# Patient Record
Sex: Male | Born: 1995 | Race: White | Hispanic: Yes | Marital: Single | Smoking: Never smoker
Health system: Southern US, Community
[De-identification: ages and names within clinical notes are randomized; demographics above are authoritative.]

---
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
## 2014-10-03 ENCOUNTER — Encounter (HOSPITAL_COMMUNITY): Payer: Self-pay | Admitting: Emergency Medicine

## 2014-10-03 ENCOUNTER — Emergency Department (HOSPITAL_COMMUNITY)
Admission: EM | Admit: 2014-10-03 | Discharge: 2014-10-04 | Disposition: A | Discharge status: Home / Self Care | Payer: Medicaid Other | Attending: Emergency Medicine | Admitting: Emergency Medicine

## 2014-10-03 ENCOUNTER — Emergency Department (HOSPITAL_COMMUNITY): Payer: Medicaid Other

## 2014-10-03 DIAGNOSIS — Y9289 Other specified places as the place of occurrence of the external cause: Secondary | ICD-10-CM | POA: Insufficient documentation

## 2014-10-03 DIAGNOSIS — S5002XA Contusion of left elbow, initial encounter: Secondary | ICD-10-CM | POA: Insufficient documentation

## 2014-10-03 DIAGNOSIS — S0083XA Contusion of other part of head, initial encounter: Secondary | ICD-10-CM | POA: Insufficient documentation

## 2014-10-03 DIAGNOSIS — Y998 Other external cause status: Secondary | ICD-10-CM | POA: Insufficient documentation

## 2014-10-03 DIAGNOSIS — Y9389 Activity, other specified: Secondary | ICD-10-CM | POA: Insufficient documentation

## 2014-10-03 DIAGNOSIS — Z720 Tobacco use: Secondary | ICD-10-CM | POA: Insufficient documentation

## 2014-10-03 MED ORDER — HYDROCODONE-ACETAMINOPHEN 5-325 MG PO TABS
1.0000 | ORAL_TABLET | Freq: Once | ORAL | Status: AC
  Administered 2014-10-03: 1 via ORAL
  Filled 2014-10-03: qty 1
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
## 2014-10-04 MED ORDER — METHOCARBAMOL 500 MG PO TABS: 500.0000 mg | ORAL_TABLET | Freq: Two times a day (BID) | ORAL | Status: DC

## 2014-10-04 MED ORDER — NAPROXEN 500 MG PO TABS: 500.0000 mg | ORAL_TABLET | Freq: Two times a day (BID) | ORAL | Status: DC

## 2014-10-04 MED ORDER — KETOROLAC TROMETHAMINE 60 MG/2ML IM SOLN
60.0000 mg | Freq: Once | INTRAMUSCULAR | Status: AC
  Administered 2014-10-04: 60 mg via INTRAMUSCULAR
  Filled 2014-10-04: qty 2

## 2014-10-04 NOTE — Discharge Instructions (Signed)
Contusion °A contusion is the result of an injury to the skin and underlying tissues and is usually caused by direct trauma. The injury results in the appearance of a bruise on the skin overlying the injured tissues. Contusions cause rupture and bleeding of the small capillaries and blood vessels and affect function, because the bleeding infiltrates muscles, tendons, nerves, or other soft tissues.  °SYMPTOMS  °· Swelling and often a hard lump in the injured area, either superficial or deep. °· Pain and tenderness over the area of the contusion. °· Feeling of firmness when pressure is exerted over the contusion. °· Discoloration under the skin, beginning with redness and progressing to the characteristic "black and blue" bruise. °CAUSES  °A contusion is typically the result of direct trauma. This is often by a blunt object.  °RISK INCREASES WITH: °· Sports that have a high likelihood of trauma (football, boxing, ice hockey, soccer, field hockey, martial arts, basketball, and baseball). °· Sports that make falling from a height likely (high-jumping, pole-vaulting, skating, or gymnastics). °· Any bleeding disorder (hemophilia) or taking medications that affect clotting (aspirin, nonsteroidal anti-inflammatory medications, or warfarin [Coumadin]). °· Inadequate protection of exposed areas during contact sports. °PREVENTION °· Maintain physical fitness: °¨ Joint and muscle flexibility. °¨ Strength and endurance. °¨ Coordination. °· Wear proper protective equipment. Make sure it fits correctly. °PROGNOSIS  °Contusions typically heal without any complications. Healing time varies with the severity of injury and intake of medications that affect clotting. Contusions usually heal in 1 to 4 weeks. °RELATED COMPLICATIONS  °· Damage to nearby nerves or blood vessels, causing numbness, coldness, or paleness. °· Compartment syndrome. °· Bleeding into the soft tissues that leads to disability. °· Infiltrative-type bleeding,  leading to the calcification and impaired function of the injured muscle (rare). °· Prolonged healing time if usual activities are resumed too soon. °· Infection if the skin over the injury site is broken. °· Fracture of the bone underlying the contusion. °· Stiffness in the joint where the injured muscle crosses. °TREATMENT  °Treatment initially consists of resting the injured area as well as medication and ice to reduce inflammation. The use of a compression bandage may also be helpful in minimizing inflammation. As pain diminishes and movement is tolerated, the joint where the affected muscle crosses should be moved to prevent stiffness and the shortening (contracture) of the joint. Movement of the joint should begin as soon as possible. It is also important to work on maintaining strength within the affected muscles. °Occasionally, extra padding over the area of contusion may be recommended before returning to sports, particularly if re-injury is likely.  °MEDICATION  °· If pain relief is necessary these medications are often recommended: °¨ Nonsteroidal anti-inflammatory medications, such as aspirin and ibuprofen. °¨ Other minor pain relievers, such as acetaminophen, are often recommended. °· Prescription pain relievers may be given by your caregiver. Use only as directed and only as much as you need. °HEAT AND COLD °· Cold treatment (icing) relieves pain and reduces inflammation. Cold treatment should be applied for 10 to 15 minutes every 2 to 3 hours for inflammation and pain and immediately after any activity that aggravates your symptoms. Use ice packs or an ice massage. (To do an ice massage fill a large styrofoam cup with water and freeze. Tear a small amount of foam from the top so ice protrudes. Massage ice firmly over the injured area in a circle about the size of a softball.) °· Heat treatment may be used prior to   performing the stretching and strengthening activities prescribed by your caregiver,  physical therapist, or athletic trainer. Use a heat pack or a warm soak. °SEEK MEDICAL CARE IF:  °· Symptoms get worse or do not improve despite treatment in a few days. °· You have difficulty moving a joint. °· Any extremity becomes extremely painful, numb, pale, or cool (This is an emergency!). °· Medication produces any side effects (bleeding, upset stomach, or allergic reaction). °· Signs of infection (drainage from skin, headache, muscle aches, dizziness, fever, or general ill feeling) occur if skin was broken. °Document Released: 06/09/2005 Document Revised: 09/01/2011 Document Reviewed: 09/21/2008 °[employer]® Patient Information ©2015 [employer]. This information is not intended to replace advice given to you by your health care provider. Make sure you discuss any questions you have with your health care provider. ° °

## 2015-06-13 ENCOUNTER — Emergency Department (HOSPITAL_COMMUNITY)
Admission: EM | Admit: 2015-06-13 | Discharge: 2015-06-13 | Disposition: A | Discharge status: Home / Self Care | Payer: Medicaid Other | Attending: Emergency Medicine | Admitting: Emergency Medicine

## 2015-06-13 ENCOUNTER — Encounter (HOSPITAL_COMMUNITY): Payer: Self-pay

## 2015-06-13 DIAGNOSIS — J069 Acute upper respiratory infection, unspecified: Secondary | ICD-10-CM

## 2015-06-13 DIAGNOSIS — Z79899 Other long term (current) drug therapy: Secondary | ICD-10-CM | POA: Insufficient documentation

## 2015-06-13 DIAGNOSIS — J020 Streptococcal pharyngitis: Secondary | ICD-10-CM

## 2015-06-13 DIAGNOSIS — Z791 Long term (current) use of non-steroidal anti-inflammatories (NSAID): Secondary | ICD-10-CM | POA: Insufficient documentation

## 2015-06-13 LAB — RAPID STREP SCREEN (MED CTR MEBANE ONLY): STREPTOCOCCUS, GROUP A SCREEN (DIRECT): POSITIVE — AB

## 2015-06-13 MED ORDER — IBUPROFEN 800 MG PO TABS
800.0000 mg | ORAL_TABLET | Freq: Once | ORAL | Status: AC
  Administered 2015-06-13: 800 mg via ORAL
  Filled 2015-06-13: qty 1

## 2015-06-13 MED ORDER — BENZONATATE 100 MG PO CAPS: 100.0000 mg | ORAL_CAPSULE | Freq: Three times a day (TID) | ORAL | Status: DC

## 2015-06-13 MED ORDER — IBUPROFEN 800 MG PO TABS: 800.0000 mg | ORAL_TABLET | Freq: Three times a day (TID) | ORAL | Status: DC

## 2015-06-13 MED ORDER — PENICILLIN G BENZATHINE 1200000 UNIT/2ML IM SUSP
1.2000 10*6.[IU] | Freq: Once | INTRAMUSCULAR | Status: AC
  Administered 2015-06-13: 1.2 10*6.[IU] via INTRAMUSCULAR
  Filled 2015-06-13: qty 2

## 2015-06-13 MED ORDER — ACETAMINOPHEN 325 MG PO TABS
650.0000 mg | ORAL_TABLET | Freq: Once | ORAL | Status: AC | PRN
  Administered 2015-06-13: 650 mg via ORAL
  Filled 2015-06-13: qty 2

## 2015-06-13 NOTE — Discharge Instructions (Signed)
You have been seen today for sore throat and cough. Your lab tests indicate the you have strep throat. The shot of penicillin that you received here in the ED should treat this. The rest of the treatment is symptomatic. May take ibuprofen or Tylenol for fever or pain. Use Chloraseptic Spray and drink warm liquids for relief of sore throat. Follow up with PCP as needed. Return to ED should symptoms worsen.

## 2015-06-13 NOTE — ED Provider Notes (Signed)
CSN: [id number]     Arrival date & time 06/13/15  0840 History   First MD Initiated Contact with Patient 06/13/15 [phone]     Chief Complaint  Patient presents with  . URI  . Sore Throat     (Consider location/radiation/quality/duration/timing/severity/associated sxs/prior Treatment) HPI   Corey Simon is a 19 y.o. male, patient with no significant past medical history, presenting to the ED with sore throat for the last four days. Pt also complains of a non-productive cough. Pt rates his pain at 8/10, sharp, non-radiating. Pt has tried tylenol and motrin with no relief with last dose of either yesterday. Subjective fever reported. Pt denies N/V/C/D, abdominal pain, chest pain, shortness of breath, trouble with breathing, or any other complaints. Pt states it is painful to swallow, but he is still able to get liquids and food down.    History reviewed. No pertinent past medical history. History reviewed. No pertinent past surgical history. No family history on file. Social History  Substance Use Topics  . Smoking status: Never Smoker   . Smokeless tobacco: None  . Alcohol Use: Yes     Comment: socially    Review of Systems  Constitutional: Negative for chills and diaphoresis.  HENT: Positive for sore throat.   Respiratory: Positive for cough. Negative for shortness of breath and wheezing.   Gastrointestinal: Negative for nausea, vomiting, abdominal pain, diarrhea, constipation and blood in stool.  Genitourinary: Negative for dysuria and flank pain.  Neurological: Negative for syncope and weakness.  All other systems reviewed and are negative.     Allergies  Review of patient's allergies indicates no known allergies.  Home Medications   Prior to Admission medications   Medication Sig Start Date End Date Taking? Authorizing Provider  bismuth subsalicylate (PEPTO BISMOL) 262 MG chewable tablet Chew 262-524 mg by mouth as needed. Upset stomach   Yes Historical  Provider, MD  ibuprofen (ADVIL,MOTRIN) 600 MG tablet Take 600 mg by mouth every 6 (six) hours as needed for moderate pain.   Yes Historical Provider, MD  benzonatate (TESSALON) 100 MG capsule Take 1 capsule (100 mg total) by mouth every 8 (eight) hours. 06/13/15   Amyla Heffner C Glorine Hanratty, PA-C  ibuprofen (ADVIL,MOTRIN) 800 MG tablet Take 1 tablet (800 mg total) by mouth 3 (three) times daily. 06/13/15   Marra Fraga C Suzi Hernan, PA-C  methocarbamol (ROBAXIN) 500 MG tablet Take 1 tablet (500 mg total) by mouth 2 (two) times daily. 10/04/14   Fayrene Helper, PA-C  naproxen (NAPROSYN) 500 MG tablet Take 1 tablet (500 mg total) by mouth 2 (two) times daily. 10/04/14   Fayrene Helper, PA-C   BP 128/67 mmHg  Pulse 98  Temp(Src) 100.1 F (37.8 C) (Oral)  Resp 18  Ht  (1.753 m)  Wt 90.719 kg  BMI 29.52 kg/m2  SpO2 99% Physical Exam  Constitutional: He appears well-developed and well-nourished. No distress.  HENT:  Head: Normocephalic and atraumatic.  Mouth/Throat: Uvula is midline and mucous membranes are normal. Posterior oropharyngeal edema and posterior oropharyngeal erythema present. No tonsillar abscesses.  Eyes: Conjunctivae are normal. Pupils are equal, round, and reactive to light.  Neck: Normal range of motion. Neck supple.  Cardiovascular: Normal rate, regular rhythm and normal heart sounds.   Pulmonary/Chest: Effort normal and breath sounds normal. No respiratory distress.  Abdominal: Soft. Bowel sounds are normal.  Musculoskeletal: He exhibits no edema or tenderness.  Lymphadenopathy:    He has no cervical adenopathy.  Neurological: He is alert.  Skin:  Skin is warm and dry. He is not diaphoretic.  Nursing note and vitals reviewed.   ED Course  Procedures (including critical care time) Labs Review Labs Reviewed  RAPID STREP SCREEN (NOT [facility]) - Abnormal; Notable for the following:    Streptococcus, Group A Screen (Direct) POSITIVE (*)    All other components within normal limits    Imaging  Review No results found. I have personally reviewed and evaluated these lab results as part of my medical decision-making.   EKG Interpretation None      MDM   Final diagnoses:  Strep pharyngitis  URI (upper respiratory infection)    Corey Simon presents with sore throat and non-productive cough for the past four days.  Patient has a positive strep test indicating strep pharyngitis. No evidence of peritonsillar abscess. Patient can readily swallow and has no difficulty breathing. He is nontoxic appearing, not tachycardic, is normotensive, and in no apparent distress. Patient opted for IM penicillin. No indications for imaging at this time. Pt given instructions for home care and return precautions. Pt voiced understanding of these instructions, agreed to the plan, and is comfortable with discharge.   Anselm PancoastShawn C Javier Mamone, PA-C 06/13/15 0942  Anselm PancoastShawn C Elsey Holts, PA-C 06/13/15 [phone]  Alvira MondayErin Schlossman, MD 06/14/15 [phone]

## 2015-06-13 NOTE — ED Notes (Signed)
Pt. Presents with complaint of cold symptoms x 3 days and sore throat. Pt. States painful to swallow, has tried tylenol/motrin at home with no improvement. Productive cough with yellow mucus.

## 2019-11-27 ENCOUNTER — Other Ambulatory Visit: Payer: Self-pay

## 2019-11-27 ENCOUNTER — Emergency Department (HOSPITAL_COMMUNITY)
Admission: EM | Admit: 2019-11-27 | Discharge: 2019-11-27 | Disposition: A | Discharge status: Home / Self Care | Payer: Medicaid Other | Attending: Emergency Medicine | Admitting: Emergency Medicine

## 2019-11-27 ENCOUNTER — Encounter (HOSPITAL_COMMUNITY): Payer: Self-pay

## 2019-11-27 ENCOUNTER — Emergency Department (HOSPITAL_COMMUNITY): Payer: Medicaid Other

## 2019-11-27 DIAGNOSIS — M436 Torticollis: Secondary | ICD-10-CM

## 2019-11-27 MED ORDER — CYCLOBENZAPRINE HCL 10 MG PO TABS: 5.0000 mg | ORAL_TABLET | Freq: Two times a day (BID) | ORAL | 0 refills | Status: DC | PRN

## 2019-11-27 MED ORDER — CELECOXIB 200 MG PO CAPS: 200.0000 mg | ORAL_CAPSULE | Freq: Two times a day (BID) | ORAL | 0 refills | Status: DC

## 2019-11-27 NOTE — Discharge Instructions (Signed)
Get help right away if: You have trouble breathing. You develop noisy breathing (stridor). You start to drool. You have trouble swallowing or pain when swallowing. You develop numbness or weakness in your hands or feet. You have changes in your speech, understanding, or vision. You are in severe pain. You cannot move your head or neck. 

## 2019-11-27 NOTE — ED Provider Notes (Signed)
[facility]-EMERGENCY DEPT Provider Note   CSN: [id number] Arrival date & time: 11/27/19  2018     History Chief Complaint  Patient presents with  . Neck Pain    Corey Simon is a 24 y.o. male presents emergency department with a chief complaint of neck pain.  Patient was jumping in the pool off the diving board today.  He states that he jumped feet first and when he came up to the top of the water he felt like his neck was hurting very badly on the left posterior side.  He felt the pain was sharp.  He was unable to turn his neck.  He rated the pain a 10 out of 10.  It did not radiate.  He denies upper extremity weakness.  HPI     History reviewed. No pertinent past medical history.  There are no problems to display for this patient.   History reviewed. No pertinent surgical history.     No family history on file.  Social History   Tobacco Use  . Smoking status: Never Smoker  Substance Use Topics  . Alcohol use: Yes    Comment: socially  . Drug use: Yes    Types: Marijuana    Home Medications Prior to Admission medications   Medication Sig Start Date End Date Taking? Authorizing Provider  celecoxib (CELEBREX) 200 MG capsule Take 1 capsule (200 mg total) by mouth 2 (two) times daily. 11/27/19   Arthor Captain, PA-C  cyclobenzaprine (FLEXERIL) 10 MG tablet Take 0.5-1 tablets (5-10 mg total) by mouth 2 (two) times daily as needed for muscle spasms. 11/27/19   Arthor Captain, PA-C    Allergies    Patient has no known allergies.  Review of Systems   Review of Systems Ten systems reviewed and are negative for acute change, except as noted in the HPI.   Physical Exam Updated Vital Signs BP (!) 166/102 (BP Location: Left Arm)   Pulse 80   Temp 99.3 F (37.4 C)   Resp 17   Ht 5\' 8"  (1.727 m)   Wt 96.2 kg   SpO2 100%   BMI 32.23 kg/m   Physical Exam Vitals and nursing note reviewed.  Constitutional:      General: He is not in  acute distress.    Appearance: He is well-developed. He is not diaphoretic.  HENT:     Head: Normocephalic and atraumatic.  Eyes:     General: No scleral icterus.    Extraocular Movements: Extraocular movements intact.     Conjunctiva/sclera: Conjunctivae normal.     Pupils: Pupils are equal, round, and reactive to light.  Neck:      Comments: Tenderness in the left levator scapula and lateral trapezius. Range of motion limited with left lateral flexion and left lateral rotation.  Otherwise full range of motion, tenderness to the palpation of the musculature in that region with palpable spasm.  Normal upper extremity strength and sensation bilaterally with equal grip strengths. Cardiovascular:     Rate and Rhythm: Normal rate and regular rhythm.     Heart sounds: Normal heart sounds.  Pulmonary:     Effort: Pulmonary effort is normal. No respiratory distress.     Breath sounds: Normal breath sounds.  Abdominal:     Palpations: Abdomen is soft.     Tenderness: There is no abdominal tenderness.  Musculoskeletal:     Cervical back: Neck supple. Torticollis present. No edema, erythema, rigidity or crepitus. Pain with movement  and muscular tenderness present. No spinous process tenderness. Decreased range of motion.  Skin:    General: Skin is warm and dry.  Neurological:     Mental Status: He is alert.  Psychiatric:        Behavior: Behavior normal.     ED Results / Procedures / Treatments   Labs (all labs ordered are listed, but only abnormal results are displayed) Labs Reviewed - No data to display  EKG None  Radiology CT Cervical Spine Wo Contrast  Result Date: 11/27/2019 CLINICAL DATA:  Neck pain after injury.  Jumped into a pool today. EXAM: CT CERVICAL SPINE WITHOUT CONTRAST TECHNIQUE: Multidetector CT imaging of the cervical spine was performed without intravenous contrast. Multiplanar CT image reconstructions were also generated. COMPARISON:  None. FINDINGS: Alignment:  Straightening of normal lordosis. No traumatic subluxation. Skull base and vertebrae: No acute fracture. Vertebral body heights are maintained. The dens and skull base are intact. Soft tissues and spinal canal: No prevertebral fluid or swelling. No visible canal hematoma. Disc levels:  Normal. Upper chest: Negative. Other: None. IMPRESSION: Straightening of normal lordosis may be due to positioning or muscle spasm. No acute fracture or traumatic subluxation of the cervical spine. Electronically Signed   By: Keith Rake M.D.   On: 11/27/2019 22:19    Procedures Procedures (including critical care time)  Medications Ordered in ED Medications - No data to display  ED Course  I have reviewed the triage vital signs and the nursing notes.  Pertinent labs & imaging results that were available during my care of the patient were reviewed by me and considered in my medical decision making (see chart for details).    MDM Rules/Calculators/A&P                      Patient here with acute neck pain.  He did not have a head or neck injury.  I personally reviewed the images of the CT which show no acute abnormalities.  His exam is consistent with acute torticollis.  Will discharge with Celebrex and Flexeril.  Discussed outpatient follow-up, supportive care and return precautions. Final Clinical Impression(s) / ED Diagnoses Final diagnoses:  Torticollis, acute    Rx / DC Orders ED Discharge Orders         Ordered    cyclobenzaprine (FLEXERIL) 10 MG tablet  2 times daily PRN,   Status:  Discontinued     11/27/19 2237    celecoxib (CELEBREX) 200 MG capsule  2 times daily     11/27/19 2237    cyclobenzaprine (FLEXERIL) 10 MG tablet  2 times daily PRN     11/27/19 2242           Margarita Mail, PA-C 11/27/19 2253    Truddie Hidden, MD 11/27/19 2322

## 2019-11-27 NOTE — ED Triage Notes (Signed)
Patient arrived stating he jumped in the pool today around 3pm and feels like he pulled a muscle. States its a sharp pain and he is unable to move his head to the side. Patient states he took a morphine with no relief.

## 2021-07-27 IMAGING — CT CT CERVICAL SPINE W/O CM
3 of 4 series · 10 of 33 positions shown, 12 images · non-contrast
Comparison: None.

CLINICAL DATA: Neck pain after injury.  Jumped into a pool today.

EXAM:
CT CERVICAL SPINE WITHOUT CONTRAST
TECHNIQUE: Multidetector CT imaging of the cervical spine was performed without
intravenous contrast. Multiplanar CT image reconstructions were also
generated.

[Series 7: orthogonal bone · axial · 0.23mm/px · z∈[-205,-114]mm · 2 of 115 slices shown, 3 images]
[im 33/115  soft-tissue]
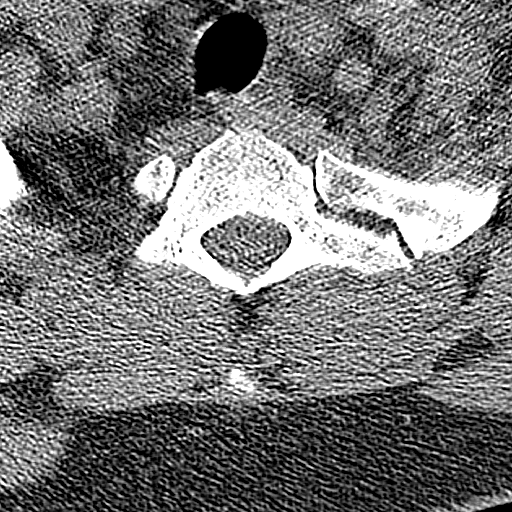
[im 33/115  bone]
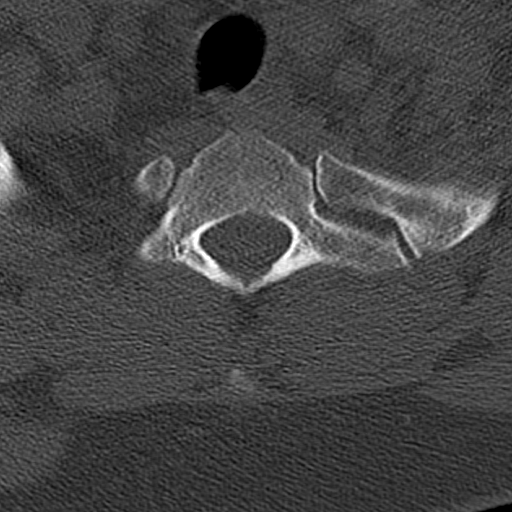
[im 82/115  bone]
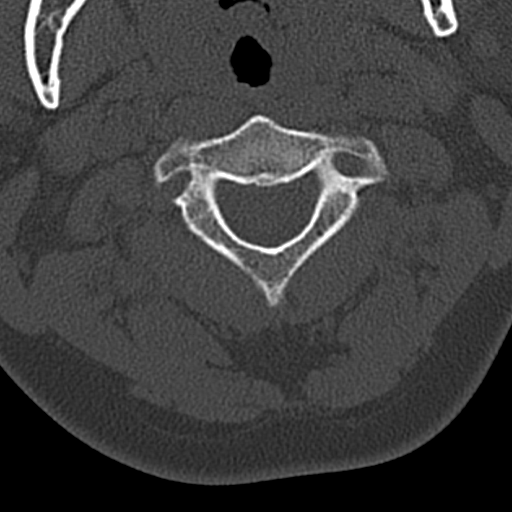

[Series 8: coronal bone · coronal · 0.24mm/px · 3 of 61 slices shown]
[im 13/61  bone]
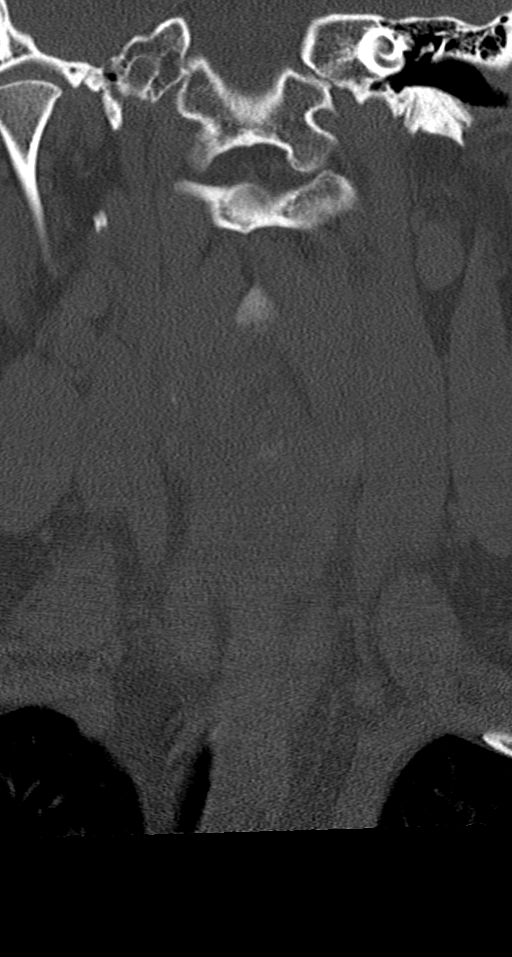
[im 25/61  bone]
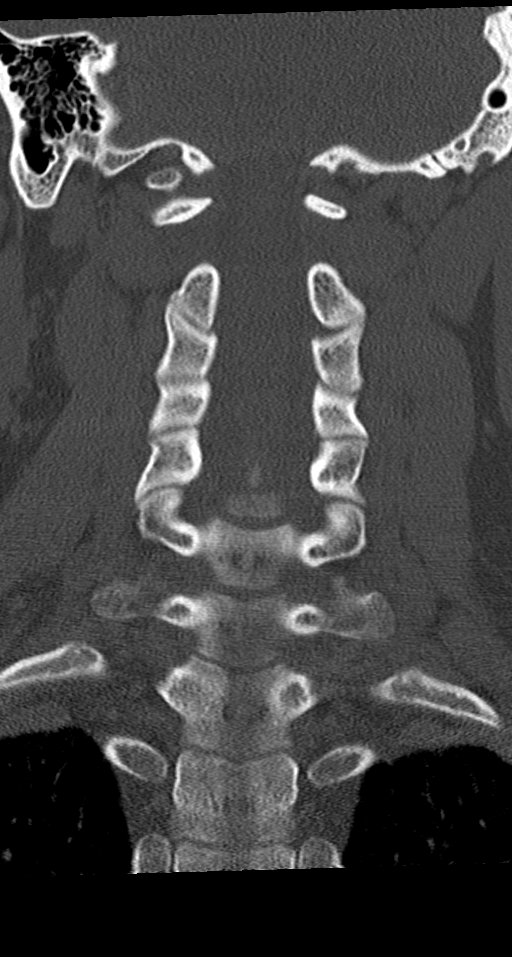
[im 37/61  bone]
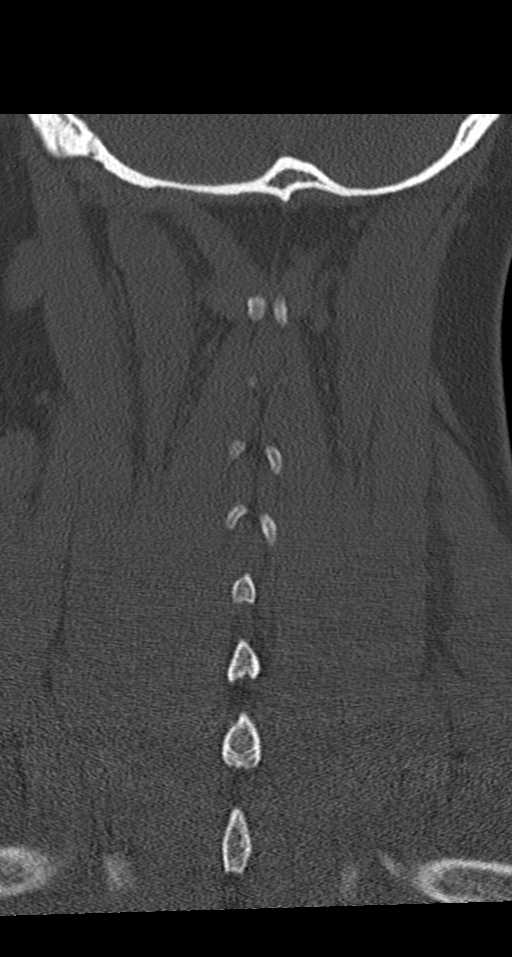

[Series 9: sagittal bone · sagittal · 0.26mm/px · 5 of 61 slices shown, 6 images]
[im 21/61  bone]
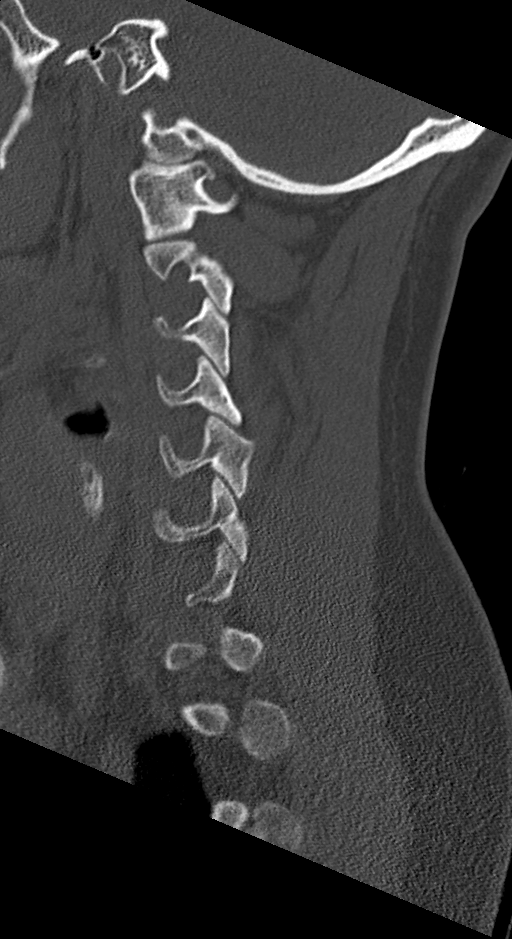
[im 26/61  bone]
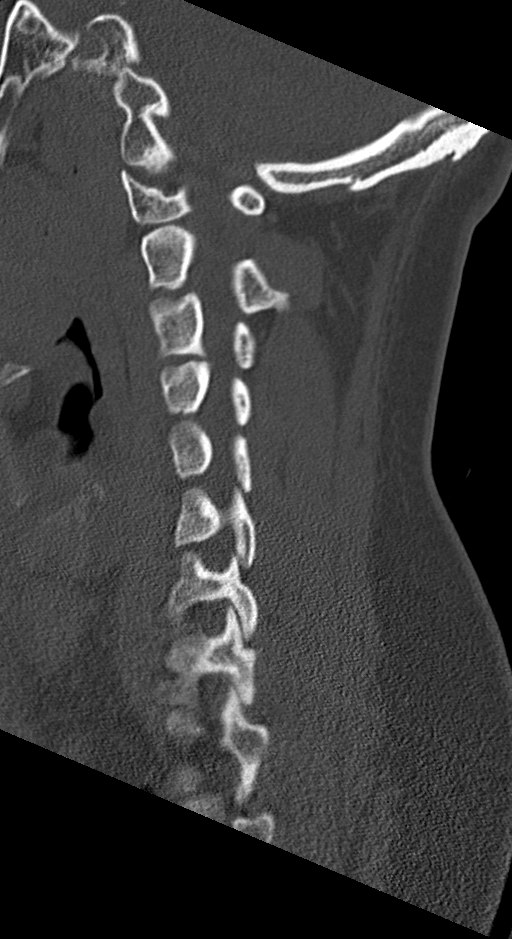
[im 31/61  soft-tissue]
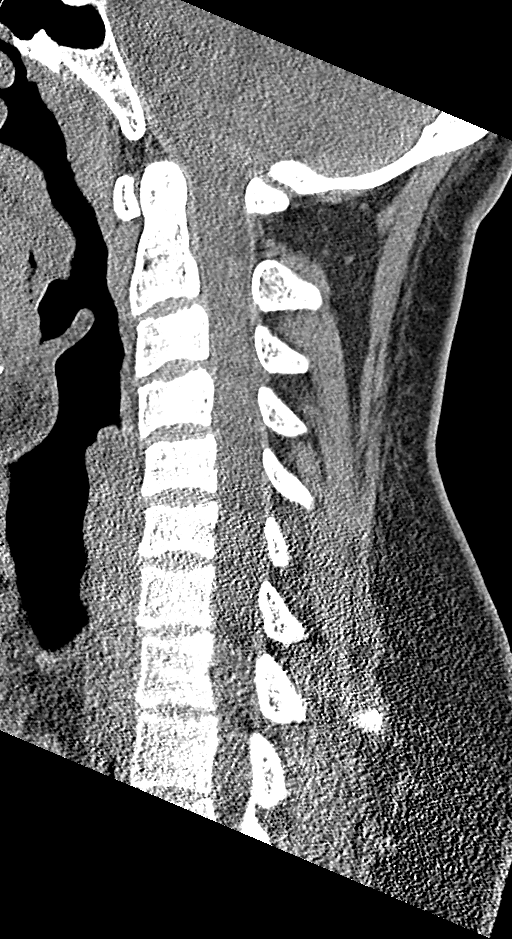
[im 31/61  bone]
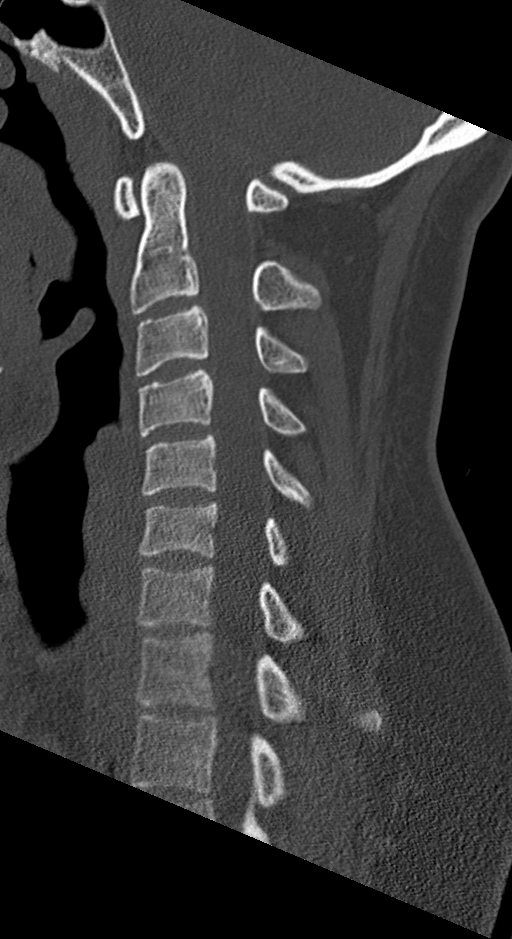
[im 36/61  bone]
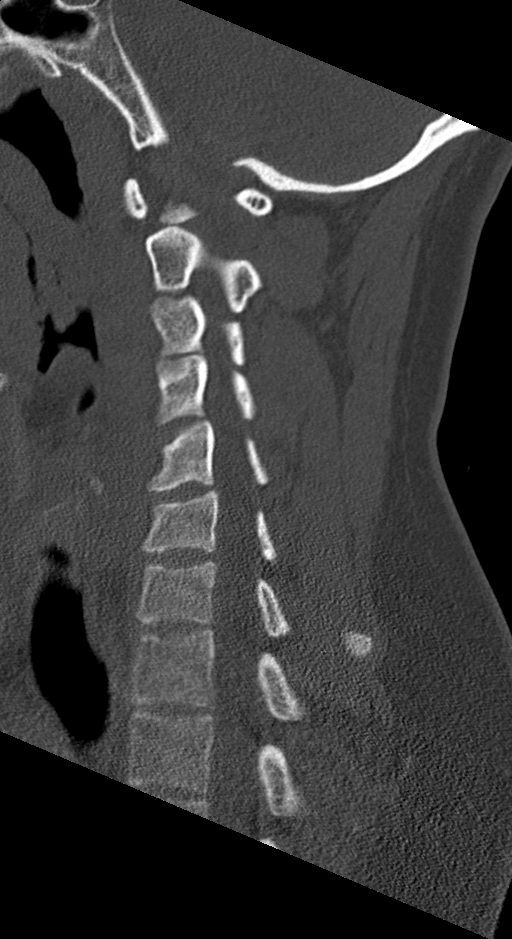
[im 41/61  bone]
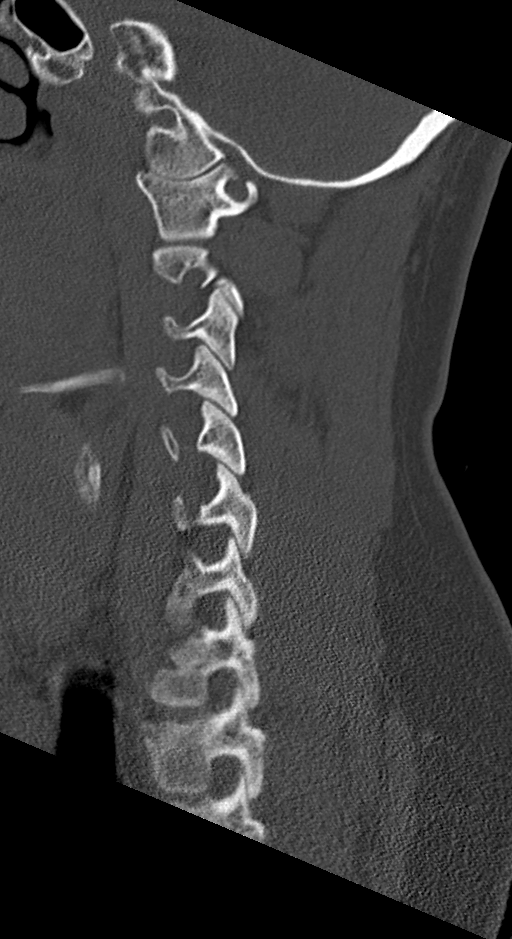

[10 of 33 positions shown; findings below may reference images not displayed]

FINDINGS: Alignment: Straightening of normal lordosis. No traumatic
subluxation.

Skull base and vertebrae: No acute fracture. Vertebral body heights
are maintained. The dens and skull base are intact.

Soft tissues and spinal canal: No prevertebral fluid or swelling. No
visible canal hematoma.

Disc levels:  Normal.

Upper chest: Negative.

Other: None.
IMPRESSION: Straightening of normal lordosis may be due to positioning or muscle
spasm. No acute fracture or traumatic subluxation of the cervical
spine.

## 2023-04-02 ENCOUNTER — Encounter (HOSPITAL_COMMUNITY): Payer: Self-pay

## 2023-04-02 ENCOUNTER — Emergency Department (HOSPITAL_COMMUNITY)
Admission: EM | Admit: 2023-04-02 | Discharge: 2023-04-02 | Disposition: A | Discharge status: Home / Self Care | Payer: Medicaid Other

## 2023-04-02 ENCOUNTER — Emergency Department (HOSPITAL_COMMUNITY): Payer: Medicaid Other

## 2023-04-02 DIAGNOSIS — S93431A Sprain of tibiofibular ligament of right ankle, initial encounter: Secondary | ICD-10-CM | POA: Insufficient documentation

## 2023-04-02 DIAGNOSIS — S99911A Unspecified injury of right ankle, initial encounter: Secondary | ICD-10-CM | POA: Diagnosis present

## 2023-04-02 DIAGNOSIS — S93491A Sprain of other ligament of right ankle, initial encounter: Secondary | ICD-10-CM

## 2023-04-02 DIAGNOSIS — Y92410 Unspecified street and highway as the place of occurrence of the external cause: Secondary | ICD-10-CM | POA: Insufficient documentation

## 2023-04-02 DIAGNOSIS — M7989 Other specified soft tissue disorders: Secondary | ICD-10-CM | POA: Insufficient documentation

## 2023-04-02 MED ORDER — KETOROLAC TROMETHAMINE 15 MG/ML IJ SOLN
15.0000 mg | Freq: Once | INTRAMUSCULAR | Status: AC
  Administered 2023-04-02: 15 mg via INTRAMUSCULAR
  Filled 2023-04-02: qty 1

## 2023-04-02 NOTE — Progress Notes (Signed)
Orthopedic Tech Progress Note Patient Details:  Corey Simon [facility] 04-18-1996 [id number]  Ortho Devices Type of Ortho Device: ASO Ortho Device/Splint Location: right Ortho Device/Splint Interventions: Ordered, Application, Adjustment   Post Interventions Patient Tolerated: Well Instructions Provided: Adjustment of device, Care of device  Kizzie Fantasia 04/02/2023, 2:03 PM

## 2023-04-02 NOTE — Discharge Instructions (Signed)
As discussed, your workup in the ER today which showed for acute findings.  X-ray imaging of your ankle did not reveal any acute fracture or dislocation.  I do suspect that you have sustained an ankle sprain.  I have given you a brace to wear for support of this.  I recommend that you rest, ice, compress, and elevate your foot and take Tylenol/ibuprofen as needed for pain.  I have given you a referral to orthopedics number to call to schedule an appointment for follow-up as well.  Please call at your earliest convenience.  Return if development of any new or worsening symptoms.
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
## 2023-04-09 ENCOUNTER — Encounter (HOSPITAL_COMMUNITY): Payer: Self-pay | Admitting: Internal Medicine

## 2023-04-09 ENCOUNTER — Ambulatory Visit: Admit: 2023-04-09 | Payer: No Typology Code available for payment source | Admitting: Orthopedic Surgery

## 2023-04-09 ENCOUNTER — Emergency Department (HOSPITAL_COMMUNITY): Payer: Medicaid Other

## 2023-04-09 ENCOUNTER — Observation Stay (HOSPITAL_COMMUNITY)
Admission: EM | Admit: 2023-04-09 | Discharge: 2023-04-09 | Discharge status: Against Medical Advice | Payer: Medicaid Other | Attending: Internal Medicine | Admitting: Internal Medicine

## 2023-04-09 ENCOUNTER — Observation Stay (HOSPITAL_COMMUNITY)
Admission: EM | Admit: 2023-04-09 | Discharge: 2023-04-10 | Discharge status: Against Medical Advice | Payer: Medicaid Other | Attending: Internal Medicine | Admitting: Internal Medicine

## 2023-04-09 ENCOUNTER — Other Ambulatory Visit: Payer: Self-pay

## 2023-04-09 ENCOUNTER — Encounter (HOSPITAL_COMMUNITY): Payer: Self-pay | Admitting: *Deleted

## 2023-04-09 DIAGNOSIS — M7989 Other specified soft tissue disorders: Secondary | ICD-10-CM | POA: Diagnosis present

## 2023-04-09 DIAGNOSIS — B9562 Methicillin resistant Staphylococcus aureus infection as the cause of diseases classified elsewhere: Secondary | ICD-10-CM | POA: Diagnosis present

## 2023-04-09 DIAGNOSIS — L03012 Cellulitis of left finger: Principal | ICD-10-CM | POA: Insufficient documentation

## 2023-04-09 DIAGNOSIS — L02512 Cutaneous abscess of left hand: Secondary | ICD-10-CM | POA: Diagnosis present

## 2023-04-09 DIAGNOSIS — E66811 Obesity, class 1: Secondary | ICD-10-CM | POA: Diagnosis present

## 2023-04-09 DIAGNOSIS — Z79899 Other long term (current) drug therapy: Secondary | ICD-10-CM

## 2023-04-09 DIAGNOSIS — E669 Obesity, unspecified: Secondary | ICD-10-CM | POA: Diagnosis not present

## 2023-04-09 DIAGNOSIS — L03114 Cellulitis of left upper limb: Principal | ICD-10-CM | POA: Diagnosis present

## 2023-04-09 DIAGNOSIS — Z6829 Body mass index (BMI) 29.0-29.9, adult: Secondary | ICD-10-CM | POA: Insufficient documentation

## 2023-04-09 DIAGNOSIS — Z5329 Procedure and treatment not carried out because of patient's decision for other reasons: Secondary | ICD-10-CM | POA: Diagnosis not present

## 2023-04-09 DIAGNOSIS — M795 Residual foreign body in soft tissue: Secondary | ICD-10-CM | POA: Diagnosis not present

## 2023-04-09 DIAGNOSIS — Z6830 Body mass index (BMI) 30.0-30.9, adult: Secondary | ICD-10-CM

## 2023-04-09 DIAGNOSIS — L0390 Cellulitis, unspecified: Secondary | ICD-10-CM | POA: Diagnosis present

## 2023-04-09 DIAGNOSIS — F17200 Nicotine dependence, unspecified, uncomplicated: Secondary | ICD-10-CM | POA: Diagnosis not present

## 2023-04-09 DIAGNOSIS — Z791 Long term (current) use of non-steroidal anti-inflammatories (NSAID): Secondary | ICD-10-CM | POA: Insufficient documentation

## 2023-04-09 DIAGNOSIS — Z720 Tobacco use: Secondary | ICD-10-CM | POA: Diagnosis present

## 2023-04-09 DIAGNOSIS — L089 Local infection of the skin and subcutaneous tissue, unspecified: Principal | ICD-10-CM | POA: Diagnosis present

## 2023-04-09 LAB — CBC WITH DIFFERENTIAL/PLATELET
Abs Immature Granulocytes: 0.07 10*3/uL (ref 0.00–0.07)
Abs Immature Granulocytes: 0.05 10*3/uL (ref 0.00–0.07)
Basophils Absolute: 0.1 10*3/uL (ref 0.0–0.1)
Basophils Absolute: 0 10*3/uL (ref 0.0–0.1)
Basophils Relative: 0 %
Basophils Relative: 0 %
Eosinophils Absolute: 0.2 10*3/uL (ref 0.0–0.5)
Eosinophils Absolute: 0.2 10*3/uL (ref 0.0–0.5)
Eosinophils Relative: 1 %
Eosinophils Relative: 2 %
HCT: 44.3 % (ref 39.0–52.0)
HCT: 45.8 % (ref 39.0–52.0)
Hemoglobin: 14.5 g/dL (ref 13.0–17.0)
Hemoglobin: 14.8 g/dL (ref 13.0–17.0)
Immature Granulocytes: 1 %
Immature Granulocytes: 1 %
Lymphocytes Relative: 14 %
Lymphocytes Relative: 16 %
Lymphs Abs: 2.1 10*3/uL (ref 0.7–4.0)
Lymphs Abs: 1.7 10*3/uL (ref 0.7–4.0)
MCH: 31.4 pg (ref 26.0–34.0)
MCH: 31.2 pg (ref 26.0–34.0)
MCHC: 32.7 g/dL (ref 30.0–36.0)
MCHC: 32.3 g/dL (ref 30.0–36.0)
MCV: 95.9 fL (ref 80.0–100.0)
MCV: 96.4 fL (ref 80.0–100.0)
Monocytes Absolute: 1.2 10*3/uL — ABNORMAL HIGH (ref 0.1–1.0)
Monocytes Absolute: 0.9 10*3/uL (ref 0.1–1.0)
Monocytes Relative: 8 %
Monocytes Relative: 9 %
Neutro Abs: 11.1 10*3/uL — ABNORMAL HIGH (ref 1.7–7.7)
Neutro Abs: 7.7 10*3/uL (ref 1.7–7.7)
Neutrophils Relative %: 76 %
Neutrophils Relative %: 72 %
Platelets: 310 10*3/uL (ref 150–400)
Platelets: 307 10*3/uL (ref 150–400)
RBC: 4.62 MIL/uL (ref 4.22–5.81)
RBC: 4.75 MIL/uL (ref 4.22–5.81)
RDW: 13.6 % (ref 11.5–15.5)
RDW: 13.6 % (ref 11.5–15.5)
WBC: 14.7 10*3/uL — ABNORMAL HIGH (ref 4.0–10.5)
WBC: 10.6 10*3/uL — ABNORMAL HIGH (ref 4.0–10.5)
nRBC: 0 % (ref 0.0–0.2)
nRBC: 0 % (ref 0.0–0.2)

## 2023-04-09 LAB — BASIC METABOLIC PANEL
Anion gap: 9 (ref 5–15)
Anion gap: 10 (ref 5–15)
BUN: 11 mg/dL (ref 6–20)
BUN: 11 mg/dL (ref 6–20)
CO2: 25 mmol/L (ref 22–32)
CO2: 26 mmol/L (ref 22–32)
Calcium: 8.4 mg/dL — ABNORMAL LOW (ref 8.9–10.3)
Calcium: 8.5 mg/dL — ABNORMAL LOW (ref 8.9–10.3)
Chloride: 103 mmol/L (ref 98–111)
Chloride: 102 mmol/L (ref 98–111)
Creatinine, Ser: 0.83 mg/dL (ref 0.61–1.24)
Creatinine, Ser: 0.83 mg/dL (ref 0.61–1.24)
GFR, Estimated: >60 mL/min (ref >60)
GFR, Estimated: >60 mL/min (ref >60)
Glucose, Bld: 160 mg/dL — ABNORMAL HIGH (ref 70–99)
Glucose, Bld: 113 mg/dL — ABNORMAL HIGH (ref 70–99)
Potassium: 4 mmol/L (ref 3.5–5.1)
Potassium: 3.9 mmol/L (ref 3.5–5.1)
Sodium: 137 mmol/L (ref 135–145)
Sodium: 138 mmol/L (ref 135–145)

## 2023-04-09 LAB — HEMOGLOBIN A1C
Hgb A1c MFr Bld: 5.2 % (ref 4.8–5.6)
Mean Plasma Glucose: 102.54 mg/dL

## 2023-04-09 SURGERY — IRRIGATION AND DEBRIDEMENT WOUND
Anesthesia: General | Laterality: Left

## 2023-04-09 MED ORDER — SODIUM CHLORIDE 0.9 % IV SOLN: 1.0000 g | INTRAVENOUS | Status: DC

## 2023-04-09 MED ORDER — MORPHINE SULFATE (PF) 4 MG/ML IV SOLN: 4.0000 mg | INTRAVENOUS | Status: DC | PRN

## 2023-04-09 MED ORDER — SODIUM CHLORIDE 0.9 % IV SOLN
1.0000 g | Freq: Once | INTRAVENOUS | Status: AC
  Administered 2023-04-09: 1 g via INTRAVENOUS
  Filled 2023-04-09: qty 10

## 2023-04-09 MED ORDER — KETOROLAC TROMETHAMINE 30 MG/ML IJ SOLN
30.0000 mg | Freq: Once | INTRAMUSCULAR | Status: AC
  Administered 2023-04-09: 30 mg via INTRAVENOUS
  Filled 2023-04-09: qty 1

## 2023-04-09 MED ORDER — DIPHENHYDRAMINE HCL 50 MG/ML IJ SOLN
12.5000 mg | Freq: Once | INTRAMUSCULAR | Status: AC
  Administered 2023-04-09: 12.5 mg via INTRAVENOUS
  Filled 2023-04-09: qty 1

## 2023-04-09 MED ORDER — ACETAMINOPHEN 650 MG RE SUPP: 650.0000 mg | Freq: Four times a day (QID) | RECTAL | Status: DC | PRN

## 2023-04-09 MED ORDER — ONDANSETRON HCL 4 MG PO TABS: 4.0000 mg | ORAL_TABLET | Freq: Four times a day (QID) | ORAL | Status: DC | PRN

## 2023-04-09 MED ORDER — MORPHINE SULFATE (PF) 4 MG/ML IV SOLN
4.0000 mg | Freq: Once | INTRAVENOUS | Status: AC
  Administered 2023-04-09: 4 mg via INTRAVENOUS
  Filled 2023-04-09: qty 1

## 2023-04-09 MED ORDER — ONDANSETRON HCL 4 MG/2ML IJ SOLN: 4.0000 mg | Freq: Four times a day (QID) | INTRAMUSCULAR | Status: DC | PRN

## 2023-04-09 MED ORDER — ACETAMINOPHEN 325 MG PO TABS: 650.0000 mg | ORAL_TABLET | Freq: Four times a day (QID) | ORAL | Status: DC | PRN

## 2023-04-09 MED ORDER — SODIUM CHLORIDE (PF) 0.9 % IJ SOLN
INTRAMUSCULAR | Status: AC
  Filled 2023-04-09: qty 50

## 2023-04-09 MED ORDER — IOHEXOL 300 MG/ML  SOLN
80.0000 mL | Freq: Once | INTRAMUSCULAR | Status: AC | PRN
  Administered 2023-04-09: 80 mL via INTRAVENOUS

## 2023-04-09 MED ORDER — ENOXAPARIN SODIUM 40 MG/0.4ML IJ SOSY: 40.0000 mg | PREFILLED_SYRINGE | INTRAMUSCULAR | Status: DC
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
## 2023-04-09 NOTE — ED Notes (Signed)
Introduced myself to pt. Pt has no needs at this time. Pt is resting comfortably on stretcher. Family @bedside . Call light within reach. Bed locked and in lowest position.

## 2023-04-09 NOTE — ED Provider Notes (Signed)
Pt was initially seen by Dr Eudelia Bunch.  Please see his note.  CT scan shows evidence of rim enhancing subcutaneous hypodense collection in the ventral hand overlying the third webspace.  On exam pt has callus on palm overlying that area.  Will consult with hand surgery.  Case discussed with hand surgery team, PA FLoyd.  Recommendation is for admission for IV antibiotics and hand surgery will consult on patient to decide if patient needs any surgical intervention.  Patient to remain n.p.o.   Linwood Dibbles, MD 04/09/23 [phone]

## 2023-04-09 NOTE — ED Triage Notes (Addendum)
Pt says he has been having left hand pain and swelling, was here this morning and left because they told him he would need surgery. Returns tonight, says his symptoms are not better, denies fevers.

## 2023-04-09 NOTE — Consult Note (Signed)
Initial Consultation Note   Patient: Corey Simon [facility] AVW:[id number] DOB: 11-Feb-1996 PCP: Pcp, No DOA: 04/09/2023 DOS: the patient was seen and examined on 04/09/2023 Primary service: Bobette Mo, MD  Referring physician: Linwood Dibbles, MD Reason for consult: Left hand cellulitis  Assessment/Plan: Principal Problem:   Cellulitis of left hand Active Problems:   Tobacco use   Class 1 obesity  The patient left AMA from the ED before hand surgery saw him.  Chief Complaint  Patient presents with   Hand Problem      Swelling L    HPI: Corey Simon is a 27 y.o. male with medical history significant of tobacco use, class I obesity who is coming to the emergency department with complaints of progressively worse edema, erythema and tenderness of the left hand.  He denied any trauma but has a callus on his left hand.  He took half a tablet of oxycodone 30 mg from a friend.  He denied fever, chills, rhinorrhea, sore throat, wheezing or hemoptysis.  No chest pain, palpitations, diaphoresis, PND, orthopnea or pitting edema of the lower extremities.  No abdominal pain, nausea, emesis, diarrhea, constipation, melena or hematochezia.  No flank pain, dysuria, frequency or hematuria.  No polyuria, polydipsia, polyphagia or blurred vision.    ED course: Initial vital signs were temperature 97.6 F, pulse 78, respiration 19, BP 123/89 mmHg O2 sat 100% on room air.  The patient received morphine 4 mg IVP, diphenhydramine 12.5 mg IVP and ceftriaxone 1 g IVPB.  I added ketorolac 30 mg IVP.   Lab work: CBC showed a white count of 14.7, hemoglobin 14.5 g/dL platelets 829.  BMP with a glucose of 160 and calcium of 8.4 mg/dL.  The rest of the BMP measurements were normal.   Imaging: Left hand x-ray with no acute radiographic abnormalities of the bones of the left hand.  There is a retained radiopaque foreign body in the volar soft tissues adjacent to the third proximal phalanx, likely retained BB.   CT left hand with contrast showed a ring-enhancing subcutaneous hypodense collection or phlegmon in the ventral hand, overlying the third webspace of the hand and surrounded by soft tissue edema.  No air in the collection this could be likely viscous fluid or phlegmon.  No findings of acute myofascitis or soft tissue gas.  No findings of acute osteomyelitis.  There is a 4 mm of ulnar minus variance.  Subcutaneous VVD in the ventral soft tissues at the level of the MIP proximal phalanx of the left middle finger.  This was first seen in 2013.   Review of Systems: As mentioned in the history of present illness. All other systems reviewed and are negative. No past medical history on file.     No past surgical history on file.     Social History:  reports that he has never smoked. He does not have any smokeless tobacco history on file. He reports current alcohol use. He reports current drug use. Drug: Marijuana.   Allergies  No Known Allergies     No family history on file.       Prior to Admission medications   Not on File      Physical Exam:       Vitals:    04/09/23 0215 04/09/23 0216 04/09/23 0218 04/09/23 0650  BP:     123/89    Pulse:   78      Resp:   19      Temp:  97.6 F (36.4 C)   98.7 F (37.1 C)  TempSrc:   Oral   Oral  SpO2:   100%      Weight: 90.7 kg        Height: 5\' 8"  (1.727 m)          Physical Exam Vitals and nursing note reviewed.  Constitutional:      General: He is awake. He is not in acute distress.    Appearance: Normal appearance. He is obese.  HENT:     Head: Normocephalic.     Mouth/Throat:     Mouth: Mucous membranes are moist.  Eyes:     General: No scleral icterus.    Pupils: Pupils are equal, round, and reactive to light.  Neck:     Vascular: No JVD.  Cardiovascular:     Rate and Rhythm: Normal rate and regular rhythm.     Heart sounds: S1 normal and S2 normal.  Pulmonary:     Effort: Pulmonary effort is normal.     Breath sounds:  Normal breath sounds. No wheezing, rhonchi or rales.  Abdominal:     General: Bowel sounds are normal.     Palpations: Abdomen is soft.     Tenderness: There is no abdominal tenderness.  Musculoskeletal:     Cervical back: Neck supple.  Skin:    Findings: Erythema and lesion present.  Left hand palmar callus with surrounding erythema, edema and decreased ROM.Marland Kitchen  Please see pictures below. Neurological:     General: No focal deficit present.     Mental Status: He is alert and oriented to person, place, and time.  Psychiatric:        Mood and Affect: Mood normal.        Behavior: Behavior normal. Behavior is cooperative.          TRH will sign off at present, please call us again when needed.  There are no new results to review at this time.   Family Communication:  Primary team communication:  Thank you very much for involving [place] in the care of your patient.  Author: Bobette Mo, MD 04/09/2023 12:03 PM  For on call review [email or web address].   This document was prepared using Dragon voice recognition software and may contain some unintended transcription errors.

## 2023-04-09 NOTE — ED Notes (Signed)
Pt wants his IV out pt stated that it was hurting

## 2023-04-09 NOTE — ED Triage Notes (Signed)
Pt reports worsening swelling to left hand that started 3 days ago. Sts he had a cut to the palm of his hand. No fevers/chills.

## 2023-04-09 NOTE — ED Notes (Signed)
After pt wanted IV, pt began to walk out of the room, pt has now left the ED. Pt stated he did not want to be here anymore, pt was encouraged to stay, but pt left ED by his own choice. RN notified and MD notified of this.

## 2023-04-09 NOTE — ED Notes (Signed)
Gave patient ice pack for swollen hand.

## 2023-04-09 NOTE — ED Notes (Signed)
ED TO INPATIENT HANDOFF REPORT  Name/Age/Gender Corey Simon 27 y.o. male  Code Status    Code Status Orders  (From admission, onward)           Start     Ordered   04/09/23 1033  Full code  Continuous       Question:  By:  Answer:  Consent: discussion documented in EHR   04/09/23 1034           Code Status History     This patient has a current code status but no historical code status.       Home/SNF/Other Home  Chief Complaint Cellulitis of left hand [L03.114]  Level of Care/Admitting Diagnosis ED Disposition     ED Disposition  Admit   Condition  --   Comment  [facility] [[id number]]  Level of Care: Med-Surg [16]  May admit patient to [facility] or [facility] if equivalent level of care is available:: No  Covid Evaluation: Asymptomatic - no recent exposure (last 10 days) testing not required  Diagnosis: Cellulitis of left hand [161096]  Admitting Physician: Bobette Mo [[id number]]  Attending Physician: Bobette Mo [[id number]]  Certification:: I certify this patient will need inpatient services for at least 2 midnights  Expected Medical Readiness: 04/11/2023          Medical History History reviewed. No pertinent past medical history.  Allergies No Known Allergies  IV Location/Drains/Wounds Patient Lines/Drains/Airways Status     Active Line/Drains/Airways     Name Placement date Placement time Site Days   Peripheral IV 04/09/23 20 G Anterior;Distal;Right;Upper Arm 04/09/23  0458  Arm  less than 1            Labs/Imaging Results for orders placed or performed during the hospital encounter of 04/09/23 (from the past 48 hour(s))  CBC with Differential     Status: Abnormal   Collection Time: 04/09/23  4:12 AM  Result Value Ref Range   WBC 14.7 (H) 4.0 - 10.5 K/uL   RBC 4.62 4.22 - 5.81 MIL/uL   Hemoglobin 14.5 13.0 - 17.0 g/dL   HCT 29.5 62.1 - 30.8 %   MCV 95.9 80.0 - 100.0  fL   MCH 31.4 26.0 - 34.0 pg   MCHC 32.7 30.0 - 36.0 g/dL   RDW 65.7 84.6 - 96.2 %   Platelets 310 150 - 400 K/uL   nRBC 0.0 0.0 - 0.2 %   Neutrophils Relative % 76 %   Neutro Abs 11.1 (H) 1.7 - 7.7 K/uL   Lymphocytes Relative 14 %   Lymphs Abs 2.1 0.7 - 4.0 K/uL   Monocytes Relative 8 %   Monocytes Absolute 1.2 (H) 0.1 - 1.0 K/uL   Eosinophils Relative 1 %   Eosinophils Absolute 0.2 0.0 - 0.5 K/uL   Basophils Relative 0 %   Basophils Absolute 0.1 0.0 - 0.1 K/uL   Immature Granulocytes 1 %   Abs Immature Granulocytes 0.07 0.00 - 0.07 K/uL    Comment: Performed at [facility], [address] [place], [place] [address]  Basic metabolic panel     Status: Abnormal   Collection Time: 04/09/23  4:12 AM  Result Value Ref Range   Sodium 137 135 - 145 mmol/L   Potassium 4.0 3.5 - 5.1 mmol/L   Chloride 103 98 - 111 mmol/L   CO2 25 22 - 32 mmol/L   Glucose, Bld 160 (H) 70 - 99  mg/dL    Comment: Glucose reference range applies only to samples taken after fasting for at least 8 hours.   BUN 11 6 - 20 mg/dL   Creatinine, Ser 5.40 0.61 - 1.24 mg/dL   Calcium 8.4 (L) 8.9 - 10.3 mg/dL   GFR, Estimated >98 >11 mL/min    Comment: (NOTE) Calculated using the CKD-EPI Creatinine Equation (2021)    Anion gap 9 5 - 15    Comment: Performed at [facility], [address] [place], [place] [address]   CT HAND LEFT W CONTRAST  Result Date: 04/09/2023 CLINICAL DATA:  The patient previously had a laceration to the palm of his left hand, now with soft tissue infection suspected. Swelling and pain. EXAM: CT OF THE LEFT HAND AND WRIST WITH CONTRAST TECHNIQUE: Multidetector CT imaging of the left hand and wrist was performed according to the standard protocol following intravenous contrast administration. RADIATION DOSE REDUCTION: This exam was performed according to the departmental dose-optimization program which includes automated exposure control, adjustment of  the mA and/or kV according to patient size and/or use of iterative reconstruction technique. CONTRAST:  80mL OMNIPAQUE IOHEXOL 300 MG/ML  SOLN COMPARISON:  Left hand series performed today and 10/23/2011. No prior cross-sectional imaging. FINDINGS: Bones/Joint/Cartilage Normal bone mineralization. No evidence of fracture, dislocation or degenerative changes. There is 4 mm of ulnar minus variance. Alignment at the wrist is otherwise unremarkable. There are no findings of acute osteomyelitis. No primary pathologic process is seen. Ligaments Suboptimally assessed by CT. Muscles and Tendons No focal abnormality or abnormal enhancement is seen in the intrinsic musculature and tendons of the left hand and wrist. There are no intramuscular fluid collections. No CT findings of acute myofasciitis or soft tissue gas, although MRI would be more sensitive. Soft tissues There is a subcutaneous rim enhancing hypodense collection or phlegmon in the ventral hand, overlying the third web space of the hand and surrounded by soft tissue edema. The Hounsfield density is 35 which could suggest either viscous fluid or phlegmon. There is no air in the collection. There is a preserved fat plane between the collection and the adjacent third and fourth digit flexor tendons. Vascular structures enhance normally. Again noted is a metallic BB in the ventral soft tissues subcutaneously at the level of the mid proximal phalanx of the middle finger. This was first seen in 2013. No other focal collections or focal inflammatory process are seen but there is edema in the dorsum of the hand as well as ventrally. IMPRESSION: 1. Rim enhancing subcutaneous hypodense collection or phlegmon in the ventral hand, overlying the third web space of the hand and surrounded by soft tissue edema. The Hounsfield density is 35 which could suggest either viscous fluid or phlegmon. There is no air in the collection. 2. No CT findings of acute myofasciitis or soft  tissue gas. Lack of such findings should not delay surgical exploration in the face and strong clinical concern. 3. No findings of acute osteomyelitis. 4. 4 mm of ulnar minus variance. 5. Subcutaneous BB in the ventral soft tissues at the level of the mid proximal phalanx of the middle finger. This was first seen in 2013. Electronically Signed   By: Almira Bar M.D.   On: 04/09/2023 07:47   DG Hand Complete Left  Result Date: 04/09/2023 CLINICAL DATA:  27 year old male with pain and swelling in the left hand for the past 3 days. EXAM: LEFT HAND - COMPLETE 3+ VIEW COMPARISON:  No priors. FINDINGS:  Three views of the left hand demonstrate no acute displaced fracture, subluxation or dislocation. There is a round metallic foreign body (presumably retained BB) which projects over the soft tissues adjacent to the volar aspect of the third proximal phalanx. IMPRESSION: 1. No acute radiographic abnormality of the bones of the left hand. 2. Retained radiopaque foreign body in the volar soft tissues adjacent to the third proximal phalanx, likely retained BB. Electronically Signed   By: Trudie Reed M.D.   On: 04/09/2023 05:55    Pending Labs Unresulted Labs (From admission, onward)     Start     Ordered   04/10/23 0500  HIV Antibody (routine testing w rflx)  (HIV Antibody (Routine testing w reflex) panel)  Tomorrow morning,   R        04/09/23 1034   04/10/23 0500  CBC  Tomorrow morning,   R        04/09/23 1034   04/10/23 0500  Comprehensive metabolic panel  Tomorrow morning,   R        04/09/23 1034   04/09/23 0915  Hemoglobin A1c  Add-on,   AD        04/09/23 0914            Vitals/Pain Today's Vitals   04/09/23 0218 04/09/23 0528 04/09/23 0650 04/09/23 1100  BP: 123/89     Pulse:      Resp:      Temp:   98.7 F (37.1 C) 98.1 F (36.7 C)  TempSrc:   Oral Oral  SpO2:      Weight:      Height:      PainSc:  3       Isolation Precautions No active  isolations  Medications Medications  enoxaparin (LOVENOX) injection 40 mg (has no administration in time range)  cefTRIAXone (ROCEPHIN) 1 g in sodium chloride 0.9 % 100 mL IVPB (has no administration in time range)  acetaminophen (TYLENOL) tablet 650 mg (has no administration in time range)    Or  acetaminophen (TYLENOL) suppository 650 mg (has no administration in time range)  ondansetron (ZOFRAN) tablet 4 mg (has no administration in time range)    Or  ondansetron (ZOFRAN) injection 4 mg (has no administration in time range)  morphine (PF) 4 MG/ML injection 4 mg (has no administration in time range)  morphine (PF) 4 MG/ML injection 4 mg (4 mg Intravenous Given 04/09/23 0458)  diphenhydrAMINE (BENADRYL) injection 12.5 mg (12.5 mg Intravenous Given 04/09/23 0622)  iohexol (OMNIPAQUE) 300 MG/ML solution 80 mL (80 mLs Intravenous Contrast Given 04/09/23 0611)  cefTRIAXone (ROCEPHIN) 1 g in sodium chloride 0.9 % 100 mL IVPB (0 g Intravenous Stopped 04/09/23 0950)  ketorolac (TORADOL) 30 MG/ML injection 30 mg (30 mg Intravenous Given 04/09/23 1045)    Mobility walks

## 2023-04-10 ENCOUNTER — Other Ambulatory Visit: Payer: Self-pay

## 2023-04-10 ENCOUNTER — Encounter (HOSPITAL_COMMUNITY): Payer: Self-pay | Admitting: Family Medicine

## 2023-04-10 ENCOUNTER — Inpatient Hospital Stay (HOSPITAL_COMMUNITY): Payer: Medicaid Other | Admitting: Certified Registered Nurse Anesthetist

## 2023-04-10 ENCOUNTER — Encounter (HOSPITAL_COMMUNITY)
Admission: EM | Discharge status: Against Medical Advice | Payer: Self-pay | Source: Home / Self Care | Attending: Emergency Medicine

## 2023-04-10 DIAGNOSIS — E66811 Obesity, class 1: Secondary | ICD-10-CM | POA: Diagnosis present

## 2023-04-10 DIAGNOSIS — L0390 Cellulitis, unspecified: Secondary | ICD-10-CM | POA: Diagnosis present

## 2023-04-10 DIAGNOSIS — L089 Local infection of the skin and subcutaneous tissue, unspecified: Secondary | ICD-10-CM

## 2023-04-10 DIAGNOSIS — Z720 Tobacco use: Secondary | ICD-10-CM | POA: Diagnosis not present

## 2023-04-10 DIAGNOSIS — Z5329 Procedure and treatment not carried out because of patient's decision for other reasons: Secondary | ICD-10-CM | POA: Diagnosis not present

## 2023-04-10 DIAGNOSIS — L03114 Cellulitis of left upper limb: Secondary | ICD-10-CM

## 2023-04-10 DIAGNOSIS — Z6830 Body mass index (BMI) 30.0-30.9, adult: Secondary | ICD-10-CM | POA: Diagnosis not present

## 2023-04-10 DIAGNOSIS — L02512 Cutaneous abscess of left hand: Secondary | ICD-10-CM | POA: Diagnosis present

## 2023-04-10 DIAGNOSIS — M7989 Other specified soft tissue disorders: Secondary | ICD-10-CM | POA: Diagnosis present

## 2023-04-10 DIAGNOSIS — B9562 Methicillin resistant Staphylococcus aureus infection as the cause of diseases classified elsewhere: Secondary | ICD-10-CM | POA: Diagnosis present

## 2023-04-10 HISTORY — PX: I & D EXTREMITY: SHX5045

## 2023-04-10 LAB — CREATININE, SERUM
Creatinine, Ser: 0.75 mg/dL (ref 0.61–1.24)
GFR, Estimated: >60 mL/min (ref >60)

## 2023-04-10 LAB — BASIC METABOLIC PANEL
Anion gap: 9 (ref 5–15)
BUN: 10 mg/dL (ref 6–20)
CO2: 25 mmol/L (ref 22–32)
Calcium: 8.5 mg/dL — ABNORMAL LOW (ref 8.9–10.3)
Chloride: 102 mmol/L (ref 98–111)
Creatinine, Ser: 0.73 mg/dL (ref 0.61–1.24)
GFR, Estimated: >60 mL/min (ref >60)
Glucose, Bld: 124 mg/dL — ABNORMAL HIGH (ref 70–99)
Potassium: 4.1 mmol/L (ref 3.5–5.1)
Sodium: 136 mmol/L (ref 135–145)

## 2023-04-10 LAB — CBC WITH DIFFERENTIAL/PLATELET
Abs Immature Granulocytes: 0.05 10*3/uL (ref 0.00–0.07)
Basophils Absolute: 0.1 10*3/uL (ref 0.0–0.1)
Basophils Relative: 0 %
Eosinophils Absolute: 0.1 10*3/uL (ref 0.0–0.5)
Eosinophils Relative: 1 %
HCT: 44.2 % (ref 39.0–52.0)
Hemoglobin: 14.7 g/dL (ref 13.0–17.0)
Immature Granulocytes: 0 %
Lymphocytes Relative: 12 %
Lymphs Abs: 1.8 10*3/uL (ref 0.7–4.0)
MCH: 32 pg (ref 26.0–34.0)
MCHC: 33.3 g/dL (ref 30.0–36.0)
MCV: 96.3 fL (ref 80.0–100.0)
Monocytes Absolute: 1 10*3/uL (ref 0.1–1.0)
Monocytes Relative: 7 %
Neutro Abs: 11.9 10*3/uL — ABNORMAL HIGH (ref 1.7–7.7)
Neutrophils Relative %: 80 %
Platelets: 291 10*3/uL (ref 150–400)
RBC: 4.59 MIL/uL (ref 4.22–5.81)
RDW: 13.5 % (ref 11.5–15.5)
WBC: 14.9 10*3/uL — ABNORMAL HIGH (ref 4.0–10.5)
nRBC: 0 % (ref 0.0–0.2)

## 2023-04-10 LAB — HIV ANTIBODY (ROUTINE TESTING W REFLEX): HIV Screen 4th Generation wRfx: NONREACTIVE

## 2023-04-10 SURGERY — IRRIGATION AND DEBRIDEMENT EXTREMITY
Anesthesia: General | Site: Hand | Laterality: Left

## 2023-04-10 MED ORDER — SODIUM CHLORIDE (PF) 0.9 % IJ SOLN
INTRAMUSCULAR | Status: AC
  Filled 2023-04-10: qty 10

## 2023-04-10 MED ORDER — AMISULPRIDE (ANTIEMETIC) 5 MG/2ML IV SOLN: 10.0000 mg | Freq: Once | INTRAVENOUS | Status: DC | PRN

## 2023-04-10 MED ORDER — ONDANSETRON HCL 4 MG/2ML IJ SOLN: 4.0000 mg | Freq: Four times a day (QID) | INTRAMUSCULAR | Status: DC | PRN

## 2023-04-10 MED ORDER — FENTANYL CITRATE (PF) 100 MCG/2ML IJ SOLN
INTRAMUSCULAR | Status: AC
  Filled 2023-04-10: qty 2

## 2023-04-10 MED ORDER — ONDANSETRON HCL 4 MG PO TABS: 4.0000 mg | ORAL_TABLET | Freq: Four times a day (QID) | ORAL | Status: DC | PRN

## 2023-04-10 MED ORDER — DIPHENHYDRAMINE HCL 50 MG/ML IJ SOLN
25.0000 mg | Freq: Once | INTRAMUSCULAR | Status: DC
  Filled 2023-04-10: qty 1

## 2023-04-10 MED ORDER — OXYCODONE HCL 5 MG PO TABS
5.0000 mg | ORAL_TABLET | ORAL | Status: DC | PRN
  Administered 2023-04-10 (×2): 5 mg via ORAL
  Filled 2023-04-10 (×2): qty 1

## 2023-04-10 MED ORDER — LACTATED RINGERS IV SOLN: INTRAVENOUS | Status: DC | PRN

## 2023-04-10 MED ORDER — HYDROMORPHONE HCL 1 MG/ML IJ SOLN: 0.2500 mg | INTRAMUSCULAR | Status: DC | PRN

## 2023-04-10 MED ORDER — SODIUM CHLORIDE 0.9 % IV SOLN: 2.0000 g | INTRAVENOUS | Status: DC

## 2023-04-10 MED ORDER — SODIUM CHLORIDE 0.9 % IV SOLN
1.0000 g | Freq: Once | INTRAVENOUS | Status: AC
  Administered 2023-04-10: 1 g via INTRAVENOUS
  Filled 2023-04-10: qty 10

## 2023-04-10 MED ORDER — CEFAZOLIN SODIUM 1 G IJ SOLR
INTRAMUSCULAR | Status: AC
  Filled 2023-04-10: qty 20

## 2023-04-10 MED ORDER — SODIUM CHLORIDE 0.9 % IR SOLN
Status: DC | PRN
  Administered 2023-04-10: 1000 mL

## 2023-04-10 MED ORDER — FENTANYL CITRATE (PF) 100 MCG/2ML IJ SOLN
INTRAMUSCULAR | Status: DC | PRN
  Administered 2023-04-10 (×2): 50 ug via INTRAVENOUS
  Administered 2023-04-10: 100 ug via INTRAVENOUS

## 2023-04-10 MED ORDER — DEXTROSE-SODIUM CHLORIDE 5-0.9 % IV SOLN: INTRAVENOUS | Status: DC

## 2023-04-10 MED ORDER — ACETAMINOPHEN 650 MG RE SUPP: 650.0000 mg | Freq: Four times a day (QID) | RECTAL | Status: DC | PRN

## 2023-04-10 MED ORDER — CEFAZOLIN SODIUM-DEXTROSE 2-3 GM-%(50ML) IV SOLR
INTRAVENOUS | Status: DC | PRN
Start: 2023-04-10 — End: 2023-04-10
  Administered 2023-04-10: 2 g via INTRAVENOUS

## 2023-04-10 MED ORDER — DEXMEDETOMIDINE HCL IN NACL 80 MCG/20ML IV SOLN
INTRAVENOUS | Status: DC | PRN
Start: 2023-04-10 — End: 2023-04-10
  Administered 2023-04-10: 16 ug via INTRAVENOUS

## 2023-04-10 MED ORDER — LIDOCAINE HCL (CARDIAC) PF 100 MG/5ML IV SOSY
PREFILLED_SYRINGE | INTRAVENOUS | Status: DC | PRN
  Administered 2023-04-10: 100 mg via INTRAVENOUS

## 2023-04-10 MED ORDER — PROPOFOL 10 MG/ML IV BOLUS
INTRAVENOUS | Status: DC | PRN
  Administered 2023-04-10: 200 mg via INTRAVENOUS

## 2023-04-10 MED ORDER — OXYCODONE HCL 5 MG/5ML PO SOLN: 5.0000 mg | Freq: Once | ORAL | Status: DC | PRN

## 2023-04-10 MED ORDER — ENOXAPARIN SODIUM 40 MG/0.4ML IJ SOSY: 40.0000 mg | PREFILLED_SYRINGE | INTRAMUSCULAR | Status: DC

## 2023-04-10 MED ORDER — KETOROLAC TROMETHAMINE 30 MG/ML IJ SOLN
30.0000 mg | Freq: Four times a day (QID) | INTRAMUSCULAR | Status: AC
  Administered 2023-04-10 (×2): 30 mg via INTRAVENOUS
  Filled 2023-04-10 (×2): qty 1

## 2023-04-10 MED ORDER — HYDROMORPHONE HCL 1 MG/ML IJ SOLN
0.5000 mg | INTRAMUSCULAR | Status: DC | PRN
  Administered 2023-04-10: 0.5 mg via INTRAVENOUS
  Filled 2023-04-10: qty 1

## 2023-04-10 MED ORDER — ACETAMINOPHEN 325 MG PO TABS: 650.0000 mg | ORAL_TABLET | Freq: Four times a day (QID) | ORAL | Status: DC | PRN

## 2023-04-10 MED ORDER — MIDAZOLAM HCL 2 MG/2ML IJ SOLN
INTRAMUSCULAR | Status: AC
  Filled 2023-04-10: qty 2

## 2023-04-10 MED ORDER — PROPOFOL 10 MG/ML IV BOLUS
INTRAVENOUS | Status: AC
  Filled 2023-04-10: qty 20

## 2023-04-10 MED ORDER — ONDANSETRON HCL 4 MG/2ML IJ SOLN
INTRAMUSCULAR | Status: DC | PRN
  Administered 2023-04-10: 4 mg via INTRAVENOUS

## 2023-04-10 MED ORDER — OXYCODONE HCL 5 MG PO TABS: 5.0000 mg | ORAL_TABLET | Freq: Once | ORAL | Status: DC | PRN

## 2023-04-10 MED ORDER — MIDAZOLAM HCL 5 MG/5ML IJ SOLN
INTRAMUSCULAR | Status: DC | PRN
  Administered 2023-04-10: 2 mg via INTRAVENOUS

## 2023-04-10 MED ORDER — MEPERIDINE HCL 50 MG/ML IJ SOLN: 6.2500 mg | INTRAMUSCULAR | Status: DC | PRN

## 2023-04-10 MED ORDER — SODIUM CHLORIDE 0.9 % IV SOLN: 12.5000 mg | INTRAVENOUS | Status: DC | PRN

## 2023-04-10 MED ORDER — MORPHINE SULFATE (PF) 4 MG/ML IV SOLN
4.0000 mg | Freq: Once | INTRAVENOUS | Status: AC
  Administered 2023-04-10: 4 mg via INTRAVENOUS
  Filled 2023-04-10: qty 1

## 2023-04-10 SURGICAL SUPPLY — 31 items
BNDG CMPR 5X4 KNIT ELC UNQ LF (GAUZE/BANDAGES/DRESSINGS) ×1
BNDG ELASTIC 4INX 5YD STR LF (GAUZE/BANDAGES/DRESSINGS) IMPLANT
BNDG GAUZE DERMACEA FLUFF 4 (GAUZE/BANDAGES/DRESSINGS) IMPLANT
BNDG GZE DERMACEA 4 6PLY (GAUZE/BANDAGES/DRESSINGS) ×1
CORD BIPOLAR FORCEPS 12FT (ELECTRODE) IMPLANT
CUFF TOURN SGL QUICK 18X4 (TOURNIQUET CUFF) IMPLANT
DRAPE SURG 17X23 STRL (DRAPES) ×1 IMPLANT
ELECT REM PT RETURN 15FT ADLT (MISCELLANEOUS) IMPLANT
GAUZE PACKING IODOFORM 1/2INX (GAUZE/BANDAGES/DRESSINGS) IMPLANT
GAUZE SPONGE 4X4 12PLY STRL (GAUZE/BANDAGES/DRESSINGS) IMPLANT
GAUZE XEROFORM 1X8 LF (GAUZE/BANDAGES/DRESSINGS) IMPLANT
GLOVE BIO SURGEON STRL SZ7.5 (GLOVE) ×2 IMPLANT
GLOVE BIOGEL PI IND STRL 7.5 (GLOVE) ×1 IMPLANT
GLOVE SURG ORTHO 8.0 STRL STRW (GLOVE) ×1 IMPLANT
GOWN STRL REUS W/ TWL LRG LVL3 (GOWN DISPOSABLE) ×1 IMPLANT
GOWN STRL REUS W/TWL LRG LVL3 (GOWN DISPOSABLE) ×1
HIBICLENS CHG 4% 4OZ BTL (MISCELLANEOUS) ×1 IMPLANT
KIT BASIN OR (CUSTOM PROCEDURE TRAY) ×1 IMPLANT
KIT TURNOVER KIT A (KITS) ×1 IMPLANT
MANIFOLD NEPTUNE II (INSTRUMENTS) ×1 IMPLANT
NDL HYPO 25X1 1.5 SAFETY (NEEDLE) IMPLANT
NEEDLE HYPO 25X1 1.5 SAFETY (NEEDLE) IMPLANT
NS IRRIG 1000ML POUR BTL (IV SOLUTION) ×1 IMPLANT
PACK ORTHO EXTREMITY (CUSTOM PROCEDURE TRAY) IMPLANT
SET CYSTO W/LG BORE CLAMP LF (SET/KITS/TRAYS/PACK) IMPLANT
SUT ETHILON 4 0 PS 2 18 (SUTURE) IMPLANT
SYR 10ML LL (SYRINGE) ×1 IMPLANT
TOWEL OR 17X26 10 PK STRL BLUE (TOWEL DISPOSABLE) ×1 IMPLANT
TRAY PREMIUM WET SKIN SCRUB (MISCELLANEOUS) IMPLANT
TUBING CONNECTING 10 (TUBING) ×1 IMPLANT
UNDERPAD 30X36 HEAVY ABSORB (UNDERPADS AND DIAPERS) ×1 IMPLANT

## 2023-04-10 NOTE — H&P (Signed)
History and Physical    Patient: Corey Simon ZOX:[id number] DOB: 23-Feb-1996 DOA: 04/09/2023 DOS: the patient was seen and examined on 04/10/2023 PCP: Pcp, No  Patient coming from: Home  Chief Complaint:  Chief Complaint  Patient presents with   Hand Pain   HPI: Corey Simon is a 27 y.o. male with medical history significant of tobacco use, class I obesity who presented yesterday to the emergency department with complaints of progressively worse edema, erythema and tenderness of the left hand.  He denied any trauma but has a callus on his left hand.  He took half a tablet of oxycodone 30 mg from a friend.  He left AMA from the emergency department before being seen by hand surgery and transferred to the floor.  His pain and edema have worsened on the left hand since then.  He denied fever, chills, rhinorrhea, sore throat, wheezing or hemoptysis.  No chest pain, palpitations, diaphoresis, PND, orthopnea or pitting edema of the lower extremities.  No abdominal pain, nausea, emesis, diarrhea, constipation, melena or hematochezia.  No flank pain, dysuria, frequency or hematuria.  No polyuria, polydipsia, polyphagia or blurred vision.  Lab work: CBC showed a white count of 10.6, hemoglobin 14.8 g/dL and platelets 811.  BMP with a glucose of 113 and calcium 8.5 mg/dL.  Imaging: Left hand x-ray with no acute radiographic abnormalities of the bones of the left hand.  There is a retained radiopaque foreign body in the volar soft tissues adjacent to the third proximal phalanx, likely retained BB.  CT left hand with contrast showed a ring-enhancing subcutaneous hypodense collection or phlegmon in the ventral hand, overlying the third webspace of the hand and surrounded by soft tissue edema.  No air in the collection this could be likely viscous fluid or phlegmon.  No findings of acute myofascitis or soft tissue gas.  No findings of acute osteomyelitis.  There is a 4 mm of ulnar minus  variance.  Subcutaneous VVD in the ventral soft tissues at the level of the MIP proximal phalanx of the left middle finger.  This was first seen in 2013.  ED course: Initial vital signs were temperature 98.5 F, pulse 82, respiration 20, BP 166/106 mmHg O2 sat 95% on room air.  The patient was restarted on ceftriaxone, given morphine IVP and started on IV fluids.   Review of Systems: As mentioned in the history of present illness. All other systems reviewed and are negative. History reviewed. No pertinent past medical history. History reviewed. No pertinent surgical history. Social History:  reports that he has never smoked. He does not have any smokeless tobacco history on file. He reports current alcohol use. He reports current drug use. Drug: Marijuana.  No Known Allergies  History reviewed. No pertinent family history.  Prior to Admission medications   Not on File    Physical Exam: Vitals:   04/10/23 0400 04/10/23 0438 04/10/23 0500 04/10/23 0700  BP: 137/71  (!) 144/91 137/85  Pulse: (!) 55  68 (!) 54  Resp: 15  17   Temp:  98.1 F (36.7 C)    TempSrc:  Oral    SpO2: 96%  94% 95%   Physical Exam Vitals and nursing note reviewed.  Constitutional:      General: He is awake. He is not in acute distress.    Appearance: Normal appearance.  HENT:     Head: Normocephalic.     Mouth/Throat:     Mouth: Mucous membranes are moist.  Eyes:     General: No scleral icterus.    Pupils: Pupils are equal, round, and reactive to light.  Neck:     Vascular: No JVD.  Cardiovascular:     Rate and Rhythm: Normal rate and regular rhythm.     Heart sounds: S1 normal and S2 normal.  Pulmonary:     Effort: Pulmonary effort is normal.     Breath sounds: Normal breath sounds.  Abdominal:     General: Bowel sounds are normal. There is no distension.     Palpations: Abdomen is soft.     Tenderness: There is no abdominal tenderness. There is no guarding.  Musculoskeletal:     Left hand:  Swelling and tenderness present. Decreased range of motion.     Cervical back: Neck supple.     Right lower leg: No edema.     Left lower leg: No edema.  Skin:    General: Skin is warm and dry.     Findings: Erythema and wound present.  Neurological:     General: No focal deficit present.     Mental Status: He is alert and oriented to person, place, and time.  Psychiatric:        Mood and Affect: Mood normal.        Behavior: Behavior normal. Behavior is cooperative.     Data Reviewed:  Results are pending, will review when available.  Assessment and Plan: Principal Problem:   Cellulitis of left hand Inpatient/MedSurg. Continue IV ceftriaxone. Keep n.p.o. for now. Analgesics as needed. Antiemetics as needed. Follow CBC, CMP in AM. Hand surgery will perform I&D later today.  Active Problems:   Tobacco use Tobacco cessation advised. Nicotine replacement therapy offered. -No withdrawal symptoms at the time. -May order NicoDerm or Nicorette as needed.    Class 1 obesity Current BMI 30.41 kg/m. Lifestyle modifications. Needs to establish with a PCP.     Advance Care Planning:   Code Status: Full Code   Consults: Hand surgery Gomez Cleverly, MD).  Family Communication:   Severity of Illness: The appropriate patient status for this patient is INPATIENT. Inpatient status is judged to be reasonable and necessary in order to provide the required intensity of service to ensure the patient's safety. The patient's presenting symptoms, physical exam findings, and initial radiographic and laboratory data in the context of their chronic comorbidities is felt to place them at high risk for further clinical deterioration. Furthermore, it is not anticipated that the patient will be medically stable for discharge from the hospital within 2 midnights of admission.   * I certify that at the point of admission it is my clinical judgment that the patient will require inpatient hospital  care spanning beyond 2 midnights from the point of admission due to high intensity of service, high risk for further deterioration and high frequency of surveillance required.*  Author: Bobette Mo, MD 04/10/2023 7:45 AM  For on call review [email or web address].   This document was prepared using Dragon voice recognition software and may contain some unintended transcription errors.

## 2023-04-10 NOTE — Consult Note (Signed)
Orthopedic Hand Surgery Consultation:  Reason for Consult: Left hand infection Referring Physician: Dr. Robb Matar   HPI: Corey Simon is a(an) 27 y.o. male who had increasing pain and swelling to the left hand and now has clear evidence of infection to the palm of the left hand.  He has no systemic symptoms.  He works as a Education administrator.  He had no issues with this in the past and has not had any previous hand surgeries.  Denies numbness or tingling able to weakly flex and extend the digits.   Physical Exam: Left upper Extremity There is a palmar abscess present below the left middle finger.  This is fluctuant and indurated with surrounding erythema.  This does not track up the flexor tendons.  This does not track into the carpal tunnel.  This is mostly localized volarly.  He can weakly flex and is to the digits.  Sensation intact light touch and brisk cap refill.   Assessment/Plan: Left hand infection palmar.  Plan for I&D.  The patient is amenable to this.  Risk benefits and alternatives of surgery were discussed with the patient and he elects to proceed.  Informed consent was obtained.  Postoperatively the patient will leave the bandage on for 24 hours and then perform packing changes with p.o. antibiotics we will follow-up cultures and he will follow-up in my office in 1 week for wound check.    Mathis Dad, MD Orthopaedic Hand Surgeon [facility] Office number: [phone] [address], [address] [place], [place] [address]    History reviewed. No pertinent past medical history.  History reviewed. No pertinent surgical history.  History reviewed. No pertinent family history.  Social History:  reports that he has never smoked. He does not have any smokeless tobacco history on file. He reports current alcohol use. He reports current drug use. Drug: Marijuana.  Allergies: No Known Allergies  Medications: reviewed, no changes to patient's home medications  Results for  orders placed or performed during the hospital encounter of 04/09/23 (from the past 48 hour(s))  Basic metabolic panel     Status: Abnormal   Collection Time: 04/09/23  9:38 PM  Result Value Ref Range   Sodium 138 135 - 145 mmol/L   Potassium 3.9 3.5 - 5.1 mmol/L   Chloride 102 98 - 111 mmol/L   CO2 26 22 - 32 mmol/L   Glucose, Bld 113 (H) 70 - 99 mg/dL    Comment: Glucose reference range applies only to samples taken after fasting for at least 8 hours.   BUN 11 6 - 20 mg/dL   Creatinine, Ser 5.46 0.61 - 1.24 mg/dL   Calcium 8.5 (L) 8.9 - 10.3 mg/dL   GFR, Estimated >27 >03 mL/min    Comment: (NOTE) Calculated using the CKD-EPI Creatinine Equation (2021)    Anion gap 10 5 - 15    Comment: Performed at [facility], [address], [place], [place] [address]  CBC with Differential     Status: Abnormal   Collection Time: 04/09/23  9:38 PM  Result Value Ref Range   WBC 10.6 (H) 4.0 - 10.5 K/uL   RBC 4.75 4.22 - 5.81 MIL/uL   Hemoglobin 14.8 13.0 - 17.0 g/dL   HCT 81.8 29.9 - 37.1 %   MCV 96.4 80.0 - 100.0 fL   MCH 31.2 26.0 - 34.0 pg   MCHC 32.3 30.0 - 36.0 g/dL   RDW 69.6 78.9 - 38.1 %   Platelets 307 150 -  400 K/uL   nRBC 0.0 0.0 - 0.2 %   Neutrophils Relative % 72 %   Neutro Abs 7.7 1.7 - 7.7 K/uL   Lymphocytes Relative 16 %   Lymphs Abs 1.7 0.7 - 4.0 K/uL   Monocytes Relative 9 %   Monocytes Absolute 0.9 0.1 - 1.0 K/uL   Eosinophils Relative 2 %   Eosinophils Absolute 0.2 0.0 - 0.5 K/uL   Basophils Relative 0 %   Basophils Absolute 0.0 0.0 - 0.1 K/uL   Immature Granulocytes 1 %   Abs Immature Granulocytes 0.05 0.00 - 0.07 K/uL    Comment: Performed at [facility], [address], [place], [place] [address]  Basic metabolic panel     Status: Abnormal   Collection Time: 04/10/23  5:01 AM  Result Value Ref Range   Sodium 136 135 - 145 mmol/L   Potassium 4.1 3.5 - 5.1 mmol/L   Chloride 102 98 - 111 mmol/L   CO2 25 22 - 32 mmol/L    Glucose, Bld 124 (H) 70 - 99 mg/dL    Comment: Glucose reference range applies only to samples taken after fasting for at least 8 hours.   BUN 10 6 - 20 mg/dL   Creatinine, Ser 2.95 0.61 - 1.24 mg/dL   Calcium 8.5 (L) 8.9 - 10.3 mg/dL   GFR, Estimated >62 >13 mL/min    Comment: (NOTE) Calculated using the CKD-EPI Creatinine Equation (2021)    Anion gap 9 5 - 15    Comment: Performed at [facility], [address], [place], [place] [address]  CBC with Differential/Platelet     Status: Abnormal   Collection Time: 04/10/23  5:01 AM  Result Value Ref Range   WBC 14.9 (H) 4.0 - 10.5 K/uL   RBC 4.59 4.22 - 5.81 MIL/uL   Hemoglobin 14.7 13.0 - 17.0 g/dL   HCT 84.6 96.2 - 95.2 %   MCV 96.3 80.0 - 100.0 fL   MCH 32.0 26.0 - 34.0 pg   MCHC 33.3 30.0 - 36.0 g/dL   RDW 84.1 32.4 - 40.1 %   Platelets 291 150 - 400 K/uL   nRBC 0.0 0.0 - 0.2 %   Neutrophils Relative % 80 %   Neutro Abs 11.9 (H) 1.7 - 7.7 K/uL   Lymphocytes Relative 12 %   Lymphs Abs 1.8 0.7 - 4.0 K/uL   Monocytes Relative 7 %   Monocytes Absolute 1.0 0.1 - 1.0 K/uL   Eosinophils Relative 1 %   Eosinophils Absolute 0.1 0.0 - 0.5 K/uL   Basophils Relative 0 %   Basophils Absolute 0.1 0.0 - 0.1 K/uL   Immature Granulocytes 0 %   Abs Immature Granulocytes 0.05 0.00 - 0.07 K/uL    Comment: Performed at [facility], [address] [place], [place] [address]  Creatinine, serum     Status: None   Collection Time: 04/10/23  5:05 AM  Result Value Ref Range   Creatinine, Ser 0.75 0.61 - 1.24 mg/dL   GFR, Estimated >36 >64 mL/min    Comment: (NOTE) Calculated using the CKD-EPI Creatinine Equation (2021) Performed at [facility], [address] [place], [place] [address]   HIV Antibody (routine testing w rflx)     Status: None   Collection Time: 04/10/23  5:06 AM  Result Value Ref Range   HIV Screen 4th Generation wRfx Non Reactive Non Reactive    Comment:  Performed at [facility], [address], [place], [place] [address]    CT HAND LEFT W CONTRAST  Result Date: 04/09/2023 CLINICAL DATA:  The patient previously had a laceration to the palm of his left hand, now with soft tissue infection suspected. Swelling and pain. EXAM: CT OF THE LEFT HAND AND WRIST WITH CONTRAST TECHNIQUE: Multidetector CT imaging of the left hand and wrist was performed according to the standard protocol following intravenous contrast administration. RADIATION DOSE REDUCTION: This exam was performed according to the departmental dose-optimization program which includes automated exposure control, adjustment of the mA and/or kV according to patient size and/or use of iterative reconstruction technique. CONTRAST:  80mL OMNIPAQUE IOHEXOL 300 MG/ML  SOLN COMPARISON:  Left hand series performed today and 10/23/2011. No prior cross-sectional imaging. FINDINGS: Bones/Joint/Cartilage Normal bone mineralization. No evidence of fracture, dislocation or degenerative changes. There is 4 mm of ulnar minus variance. Alignment at the wrist is otherwise unremarkable. There are no findings of acute osteomyelitis. No primary pathologic process is seen. Ligaments Suboptimally assessed by CT. Muscles and Tendons No focal abnormality or abnormal enhancement is seen in the intrinsic musculature and tendons of the left hand and wrist. There are no intramuscular fluid collections. No CT findings of acute myofasciitis or soft tissue gas, although MRI would be more sensitive. Soft tissues There is a subcutaneous rim enhancing hypodense collection or phlegmon in the ventral hand, overlying the third web space of the hand and surrounded by soft tissue edema. The Hounsfield density is 35 which could suggest either viscous fluid or phlegmon. There is no air in the collection. There is a preserved fat plane between the collection and the adjacent third and fourth digit flexor tendons. Vascular structures enhance  normally. Again noted is a metallic BB in the ventral soft tissues subcutaneously at the level of the mid proximal phalanx of the middle finger. This was first seen in 2013. No other focal collections or focal inflammatory process are seen but there is edema in the dorsum of the hand as well as ventrally. IMPRESSION: 1. Rim enhancing subcutaneous hypodense collection or phlegmon in the ventral hand, overlying the third web space of the hand and surrounded by soft tissue edema. The Hounsfield density is 35 which could suggest either viscous fluid or phlegmon. There is no air in the collection. 2. No CT findings of acute myofasciitis or soft tissue gas. Lack of such findings should not delay surgical exploration in the face and strong clinical concern. 3. No findings of acute osteomyelitis. 4. 4 mm of ulnar minus variance. 5. Subcutaneous BB in the ventral soft tissues at the level of the mid proximal phalanx of the middle finger. This was first seen in 2013. Electronically Signed   By: Almira Bar M.D.   On: 04/09/2023 07:47   DG Hand Complete Left  Result Date: 04/09/2023 CLINICAL DATA:  27 year old male with pain and swelling in the left hand for the past 3 days. EXAM: LEFT HAND - COMPLETE 3+ VIEW COMPARISON:  No priors. FINDINGS: Three views of the left hand demonstrate no acute displaced fracture, subluxation or dislocation. There is a round metallic foreign body (presumably retained BB) which projects over the soft tissues adjacent to the volar aspect of the third proximal phalanx. IMPRESSION: 1. No acute radiographic abnormality of the bones of the left hand. 2. Retained radiopaque foreign body in the volar soft tissues adjacent to the third proximal phalanx, likely retained BB. Electronically Signed   By: Brayton Mars.D.  On: 04/09/2023 05:55    ROS: 14 point review of systems negative except per HPI

## 2023-04-10 NOTE — ED Notes (Signed)
ED TO INPATIENT HANDOFF REPORT  Name/Age/Gender Margaree Mackintosh 27 y.o. male  Code Status    Code Status Orders  (From admission, onward)           Start     Ordered   04/10/23 0334  Full code  Continuous       Question:  By:  Answer:  Consent: discussion documented in EHR   04/10/23 0334           Code Status History     Date Active Date Inactive Code Status Order ID Comments User Context   04/09/2023 1034 04/09/2023 1715 Full Code [id number]  Bobette Mo, MD ED       Home/SNF/Other Home  Chief Complaint Cellulitis [L03.90]  Level of Care/Admitting Diagnosis ED Disposition     ED Disposition  Admit   Condition  --   Comment  Hospital Area: [facility] [[id number]]  Level of Care: Med-Surg [16]  May admit patient to [facility] or [facility] if equivalent level of care is available:: Yes  Covid Evaluation: Asymptomatic - no recent exposure (last 10 days) testing not required  Diagnosis: Cellulitis [295621]  Admitting Physician: Gery Pray [[id number]]  Attending Physician: Gery Pray [[id number]]  Certification:: I certify this patient will need inpatient services for at least 2 midnights  Expected Medical Readiness: 04/12/2023          Medical History History reviewed. No pertinent past medical history.  Allergies No Known Allergies  IV Location/Drains/Wounds Patient Lines/Drains/Airways Status     Active Line/Drains/Airways     Name Placement date Placement time Site Days   Peripheral IV 04/10/23 20 G Right Forearm 04/10/23  0205  Forearm  less than 1            Labs/Imaging Results for orders placed or performed during the hospital encounter of 04/09/23 (from the past 48 hour(s))  Basic metabolic panel     Status: Abnormal   Collection Time: 04/09/23  9:38 PM  Result Value Ref Range   Sodium 138 135 - 145 mmol/L   Potassium 3.9 3.5 - 5.1 mmol/L   Chloride 102 98 - 111 mmol/L   CO2 26 22 - 32 mmol/L    Glucose, Bld 113 (H) 70 - 99 mg/dL    Comment: Glucose reference range applies only to samples taken after fasting for at least 8 hours.   BUN 11 6 - 20 mg/dL   Creatinine, Ser 3.08 0.61 - 1.24 mg/dL   Calcium 8.5 (L) 8.9 - 10.3 mg/dL   GFR, Estimated >65 >78 mL/min    Comment: (NOTE) Calculated using the CKD-EPI Creatinine Equation (2021)    Anion gap 10 5 - 15    Comment: Performed at [facility], [address] [place], [place] [address]  CBC with Differential     Status: Abnormal   Collection Time: 04/09/23  9:38 PM  Result Value Ref Range   WBC 10.6 (H) 4.0 - 10.5 K/uL   RBC 4.75 4.22 - 5.81 MIL/uL   Hemoglobin 14.8 13.0 - 17.0 g/dL   HCT 95.2 84.1 - 32.4 %   MCV 96.4 80.0 - 100.0 fL   MCH 31.2 26.0 - 34.0 pg   MCHC 32.3 30.0 - 36.0 g/dL   RDW 40.1 02.7 - 25.3 %   Platelets 307 150 - 400 K/uL   nRBC 0.0 0.0 - 0.2 %   Neutrophils Relative % 72 %   Neutro Abs 7.7 1.7 -  7.7 K/uL   Lymphocytes Relative 16 %   Lymphs Abs 1.7 0.7 - 4.0 K/uL   Monocytes Relative 9 %   Monocytes Absolute 0.9 0.1 - 1.0 K/uL   Eosinophils Relative 2 %   Eosinophils Absolute 0.2 0.0 - 0.5 K/uL   Basophils Relative 0 %   Basophils Absolute 0.0 0.0 - 0.1 K/uL   Immature Granulocytes 1 %   Abs Immature Granulocytes 0.05 0.00 - 0.07 K/uL    Comment: Performed at [facility], [address] [place], [place] [address]  Basic metabolic panel     Status: Abnormal   Collection Time: 04/10/23  5:01 AM  Result Value Ref Range   Sodium 136 135 - 145 mmol/L   Potassium 4.1 3.5 - 5.1 mmol/L   Chloride 102 98 - 111 mmol/L   CO2 25 22 - 32 mmol/L   Glucose, Bld 124 (H) 70 - 99 mg/dL    Comment: Glucose reference range applies only to samples taken after fasting for at least 8 hours.   BUN 10 6 - 20 mg/dL   Creatinine, Ser 0.35 0.61 - 1.24 mg/dL   Calcium 8.5 (L) 8.9 - 10.3 mg/dL   GFR, Estimated >00 >93 mL/min    Comment: (NOTE) Calculated using the CKD-EPI  Creatinine Equation (2021)    Anion gap 9 5 - 15    Comment: Performed at [facility], [address], [place], [place] [address]  CBC with Differential/Platelet     Status: Abnormal   Collection Time: 04/10/23  5:01 AM  Result Value Ref Range   WBC 14.9 (H) 4.0 - 10.5 K/uL   RBC 4.59 4.22 - 5.81 MIL/uL   Hemoglobin 14.7 13.0 - 17.0 g/dL   HCT 93.7 16.9 - 67.8 %   MCV 96.3 80.0 - 100.0 fL   MCH 32.0 26.0 - 34.0 pg   MCHC 33.3 30.0 - 36.0 g/dL   RDW 93.8 10.1 - 75.1 %   Platelets 291 150 - 400 K/uL   nRBC 0.0 0.0 - 0.2 %   Neutrophils Relative % 80 %   Neutro Abs 11.9 (H) 1.7 - 7.7 K/uL   Lymphocytes Relative 12 %   Lymphs Abs 1.8 0.7 - 4.0 K/uL   Monocytes Relative 7 %   Monocytes Absolute 1.0 0.1 - 1.0 K/uL   Eosinophils Relative 1 %   Eosinophils Absolute 0.1 0.0 - 0.5 K/uL   Basophils Relative 0 %   Basophils Absolute 0.1 0.0 - 0.1 K/uL   Immature Granulocytes 0 %   Abs Immature Granulocytes 0.05 0.00 - 0.07 K/uL    Comment: Performed at [facility], [address], [place], [place] [address]  Creatinine, serum     Status: None   Collection Time: 04/10/23  5:05 AM  Result Value Ref Range   Creatinine, Ser 0.75 0.61 - 1.24 mg/dL   GFR, Estimated >27 >78 mL/min    Comment: (NOTE) Calculated using the CKD-EPI Creatinine Equation (2021) Performed at [facility], [address], [place], [place] [address]    CT HAND LEFT W CONTRAST  Result Date: 04/09/2023 CLINICAL DATA:  The patient previously had a laceration to the palm of his left hand, now with soft tissue infection suspected. Swelling and pain. EXAM: CT OF THE LEFT HAND AND WRIST WITH CONTRAST TECHNIQUE: Multidetector CT imaging of the left hand and wrist was performed according to the standard protocol following intravenous contrast administration. RADIATION DOSE REDUCTION: This  exam was performed according to the departmental dose-optimization program which  includes automated exposure control, adjustment of the mA and/or kV according to patient size and/or use of iterative reconstruction technique. CONTRAST:  80mL OMNIPAQUE IOHEXOL 300 MG/ML  SOLN COMPARISON:  Left hand series performed today and 10/23/2011. No prior cross-sectional imaging. FINDINGS: Bones/Joint/Cartilage Normal bone mineralization. No evidence of fracture, dislocation or degenerative changes. There is 4 mm of ulnar minus variance. Alignment at the wrist is otherwise unremarkable. There are no findings of acute osteomyelitis. No primary pathologic process is seen. Ligaments Suboptimally assessed by CT. Muscles and Tendons No focal abnormality or abnormal enhancement is seen in the intrinsic musculature and tendons of the left hand and wrist. There are no intramuscular fluid collections. No CT findings of acute myofasciitis or soft tissue gas, although MRI would be more sensitive. Soft tissues There is a subcutaneous rim enhancing hypodense collection or phlegmon in the ventral hand, overlying the third web space of the hand and surrounded by soft tissue edema. The Hounsfield density is 35 which could suggest either viscous fluid or phlegmon. There is no air in the collection. There is a preserved fat plane between the collection and the adjacent third and fourth digit flexor tendons. Vascular structures enhance normally. Again noted is a metallic BB in the ventral soft tissues subcutaneously at the level of the mid proximal phalanx of the middle finger. This was first seen in 2013. No other focal collections or focal inflammatory process are seen but there is edema in the dorsum of the hand as well as ventrally. IMPRESSION: 1. Rim enhancing subcutaneous hypodense collection or phlegmon in the ventral hand, overlying the third web space of the hand and surrounded by soft tissue edema. The Hounsfield density is 35 which could suggest either viscous fluid or phlegmon. There is no air in the collection.  2. No CT findings of acute myofasciitis or soft tissue gas. Lack of such findings should not delay surgical exploration in the face and strong clinical concern. 3. No findings of acute osteomyelitis. 4. 4 mm of ulnar minus variance. 5. Subcutaneous BB in the ventral soft tissues at the level of the mid proximal phalanx of the middle finger. This was first seen in 2013. Electronically Signed   By: Almira Bar M.D.   On: 04/09/2023 07:47   DG Hand Complete Left  Result Date: 04/09/2023 CLINICAL DATA:  27 year old male with pain and swelling in the left hand for the past 3 days. EXAM: LEFT HAND - COMPLETE 3+ VIEW COMPARISON:  No priors. FINDINGS: Three views of the left hand demonstrate no acute displaced fracture, subluxation or dislocation. There is a round metallic foreign body (presumably retained BB) which projects over the soft tissues adjacent to the volar aspect of the third proximal phalanx. IMPRESSION: 1. No acute radiographic abnormality of the bones of the left hand. 2. Retained radiopaque foreign body in the volar soft tissues adjacent to the third proximal phalanx, likely retained BB. Electronically Signed   By: Trudie Reed M.D.   On: 04/09/2023 05:55    Pending Labs Unresulted Labs (From admission, onward)     Start     Ordered   04/17/23 0500  Creatinine, serum  (enoxaparin (LOVENOX)    CrCl >/= 30 ml/min)  Weekly,   R     Comments: while on enoxaparin therapy    04/10/23 0334   04/10/23 0449  HIV Antibody (routine testing w rflx)  Once,   R  04/10/23 0449            Vitals/Pain Today's Vitals   04/10/23 0438 04/10/23 0443 04/10/23 0500 04/10/23 0700  BP:   (!) 144/91 137/85  Pulse:   68 (!) 54  Resp:   17   Temp: 98.1 F (36.7 C)     TempSrc: Oral     SpO2:   94% 95%  PainSc:  10-Worst pain ever      Isolation Precautions No active isolations  Medications Medications  HYDROmorphone (DILAUDID) injection 0.5 mg (0.5 mg Intravenous Given 04/10/23  0443)  oxyCODONE (Oxy IR/ROXICODONE) immediate release tablet 5 mg (has no administration in time range)  dextrose 5 %-0.9 % sodium chloride infusion ( Intravenous New Bag/Given 04/10/23 0340)  enoxaparin (LOVENOX) injection 40 mg (has no administration in time range)  acetaminophen (TYLENOL) tablet 650 mg (has no administration in time range)    Or  acetaminophen (TYLENOL) suppository 650 mg (has no administration in time range)  ondansetron (ZOFRAN) tablet 4 mg (has no administration in time range)    Or  ondansetron (ZOFRAN) injection 4 mg (has no administration in time range)  cefTRIAXone (ROCEPHIN) 2 g in sodium chloride 0.9 % 100 mL IVPB (has no administration in time range)  cefTRIAXone (ROCEPHIN) 1 g in sodium chloride 0.9 % 100 mL IVPB (0 g Intravenous Stopped 04/10/23 0257)  morphine (PF) 4 MG/ML injection 4 mg (4 mg Intravenous Given 04/10/23 0316)    Mobility walks

## 2023-04-10 NOTE — Anesthesia Preprocedure Evaluation (Signed)
Anesthesia Evaluation  Patient identified by MRN, date of birth, ID band Patient awake    Reviewed: Allergy & Precautions, H&P , NPO status , Patient's Chart, lab work & pertinent test results  Airway Mallampati: II  TM Distance: >3 FB Neck ROM: Full    Dental no notable dental hx.    Pulmonary neg pulmonary ROS   Pulmonary exam normal breath sounds clear to auscultation       Cardiovascular negative cardio ROS Normal cardiovascular exam Rhythm:Regular Rate:Normal     Neuro/Psych negative neurological ROS  negative psych ROS   GI/Hepatic negative GI ROS, Neg liver ROS,,,  Endo/Other  negative endocrine ROS    Renal/GU negative Renal ROS  negative genitourinary   Musculoskeletal negative musculoskeletal ROS (+)    Abdominal   Peds negative pediatric ROS (+)  Hematology negative hematology ROS (+)   Anesthesia Other Findings   Reproductive/Obstetrics negative OB ROS                             Anesthesia Physical Anesthesia Plan  ASA: 1  Anesthesia Plan: General   Post-op Pain Management:    Induction: Intravenous  PONV Risk Score and Plan: 2 and Ondansetron, Midazolam and Treatment may vary due to age or medical condition  Airway Management Planned: LMA  Additional Equipment:   Intra-op Plan:   Post-operative Plan: Extubation in OR  Informed Consent: I have reviewed the patients History and Physical, chart, labs and discussed the procedure including the risks, benefits and alternatives for the proposed anesthesia with the patient or authorized representative who has indicated his/her understanding and acceptance.     Dental advisory given  Plan Discussed with: CRNA  Anesthesia Plan Comments:        Anesthesia Quick Evaluation

## 2023-04-10 NOTE — Transfer of Care (Signed)
Immediate Anesthesia Transfer of Care Note  Patient: Corey Simon [facility]  Procedure(s) Performed: Procedure(s): IRRIGATION AND DEBRIDEMENT LEFT HAND (Left)  Patient Location: PACU  Anesthesia Type:General  Level of Consciousness:  sedated, patient cooperative and responds to stimulation  Airway & Oxygen Therapy:Patient Spontanous Breathing and Patient connected to face mask oxgen  Post-op Assessment:  Report given to PACU RN and Post -op Vital signs reviewed and stable  Post vital signs:  Reviewed and stable  Last Vitals:  Vitals:   04/10/23 1158 04/10/23 1200  BP:  117/73  Pulse:  77  Resp:    Temp: 36.6 C   SpO2:  98%    Complications: No apparent anesthesia complications

## 2023-04-10 NOTE — Progress Notes (Signed)
Discussed patient regarding consultation for surgical invention to left hand.  Imaging reviewed and patient clearly has an organized fluid collection that needs I&D.  Plan for incision and drainage today for left hand infection.  Patient will need to remain NPO.  We will get to this at earliest operating room availability.

## 2023-04-10 NOTE — ED Provider Notes (Signed)
[facility]ENCY DEPT [facility] Emergency Department Provider Note MRN:  [id number]  Arrival date & time: 04/10/23     Chief Complaint   Hand Pain   History of Present Illness   Corey Simon is a 27 y.o. year-old male presents to the ED with chief complaint of hand infection.  Seen earlier today and recommended for admission and hand surgery consultation.  Left AMA prior to hand consult.  Now returns for treatment.  States he was scared and left earlier, but is willing to stay now.    Had a superficial cut from a few days ago that he was picking at and then his had swelled up and became painful.  Works as a Education administrator.  Denies IVDU.  History provided by patient.   Review of Systems  Pertinent positive and negative review of systems noted in HPI.    Physical Exam   Vitals:   04/10/23 0230 04/10/23 0300  BP: (!) 148/92 (!) 149/90  Pulse: 60 79  Resp: 15 19  Temp:    SpO2: 96% 98%    CONSTITUTIONAL:  non toxic-appearing, NAD NEURO:  Alert and oriented x 3, CN 3-12 grossly intact EYES:  eyes equal and reactive ENT/NECK:  Supple, no stridor  CARDIO:  normal rate, appears well-perfused, intact radial pulses, normal cap refill PULM:  No respiratory distress,  GI/GU:  non-distended,  MSK/SPINE: swelling of the left hand, mild erythema, TTP, unable to make a tight fist SKIN:  no rash, atraumatic   *Additional and/or pertinent findings included in MDM below  Diagnostic and Interventional Summary    EKG Interpretation Date/Time:    Ventricular Rate:    PR Interval:    QRS Duration:    QT Interval:    QTC Calculation:   R Axis:      Text Interpretation:         Labs Reviewed  BASIC METABOLIC PANEL - Abnormal; Notable for the following components:      Result Value   Glucose, Bld 113 (*)    Calcium 8.5 (*)    All other components within normal limits  CBC WITH DIFFERENTIAL/PLATELET - Abnormal; Notable for the following components:   WBC 10.6 (*)     All other components within normal limits  HIV ANTIBODY (ROUTINE TESTING W REFLEX)  CBC  CREATININE, SERUM  BASIC METABOLIC PANEL  CBC WITH DIFFERENTIAL/PLATELET    No orders to display    Medications  HYDROmorphone (DILAUDID) injection 0.5 mg (has no administration in time range)  oxyCODONE (Oxy IR/ROXICODONE) immediate release tablet 5 mg (has no administration in time range)  dextrose 5 %-0.9 % sodium chloride infusion (has no administration in time range)  enoxaparin (LOVENOX) injection 40 mg (has no administration in time range)  acetaminophen (TYLENOL) tablet 650 mg (has no administration in time range)    Or  acetaminophen (TYLENOL) suppository 650 mg (has no administration in time range)  ondansetron (ZOFRAN) tablet 4 mg (has no administration in time range)    Or  ondansetron (ZOFRAN) injection 4 mg (has no administration in time range)  cefTRIAXone (ROCEPHIN) 2 g in sodium chloride 0.9 % 100 mL IVPB (has no administration in time range)  cefTRIAXone (ROCEPHIN) 1 g in sodium chloride 0.9 % 100 mL IVPB (0 g Intravenous Stopped 04/10/23 0257)  morphine (PF) 4 MG/ML injection 4 mg (4 mg Intravenous Given 04/10/23 0316)     Procedures  /  Critical Care Procedures  ED Course and Medical Decision Making  I  have reviewed the triage vital signs, the nursing notes, and pertinent available records from the EMR.  Social Determinants Affecting Complexity of Care: Patient has no clinically significant social determinants affecting this chief complaint..   ED Course:    Medical Decision Making Amount and/or Complexity of Data Reviewed Labs: ordered.  Risk Prescription drug management. Decision regarding hospitalization.         Consultants: I consulted with Adela Lank, [place], from hand surgery, who recommends admission and IV abx, washout in the AM. Appreciate Dr. Joneen Roach for admitting.   Treatment and Plan: Patient's exam and diagnostic results are concerning for hand  infection.  Feel that patient will need admission to the hospital for further treatment and evaluation.    Final Clinical Impressions(s) / ED Diagnoses     ICD-10-CM   1. Infected abrasion of left hand, initial encounter  S60.512A    L08.9     2. Hand swelling  M79.89       ED Discharge Orders     None         Discharge Instructions Discussed with and Provided to Patient:   Discharge Instructions   None      Roxy Horseman, PA-C 04/10/23 1610    Glynn Octave, MD 04/10/23 [phone]

## 2023-04-10 NOTE — Anesthesia Procedure Notes (Signed)
Procedure Name: LMA Insertion Date/Time: 04/10/2023 2:58 PM  Performed by: Theodosia Quay, CRNAPre-anesthesia Checklist: Patient identified, Emergency Drugs available, Suction available, Patient being monitored and Timeout performed Patient Re-evaluated:Patient Re-evaluated prior to induction Oxygen Delivery Method: Circle system utilized Preoxygenation: Pre-oxygenation with 100% oxygen Induction Type: IV induction Ventilation: Mask ventilation without difficulty LMA: LMA with gastric port inserted LMA Size: 4.0 Tube size: 4.0 mm Number of attempts: 1 Placement Confirmation: positive ETCO2 and breath sounds checked- equal and bilateral Tube secured with: Tape Dental Injury: Teeth and Oropharynx as per pre-operative assessment

## 2023-04-11 LAB — HEMOGLOBIN A1C
Hgb A1c MFr Bld: 5.3 % (ref 4.8–5.6)
Mean Plasma Glucose: 105.41 mg/dL
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
## 2023-04-12 NOTE — Op Note (Signed)
OPERATIVE NOTE  DATE OF PROCEDURE: 04/10/2023  SURGEONS:  Primary: Gomez Cleverly, MD  ASSISTANT: Payton Mccallum, PA-C  Due to the complexity of the surgery an assistant was necessary to aid in retraction, exposure, limb positioning, closure and dressing application. The use of an assistant on this case follows CMS and CPT guidelines, which allows an assistant to be used because of the complexity level of this case.   PREOPERATIVE DIAGNOSIS: INFECTED LEFT HAND, middle finger flexor tendon sheath  POSTOPERATIVE DIAGNOSIS: Same  NAME OF PROCEDURE:   Left palm and middle finger I&D abscess and flexor tendon sheath  ANESTHESIA: General  SKIN PREPARATION: Hibiclens  ESTIMATED BLOOD LOSS: Minimal  IMPLANTS: none  INDICATIONS:  Corey Simon is a 27 y.o. male who has the above preoperative diagnosis. The patient has decided to proceed with surgical intervention.  Risks, benefits and alternatives of operative management were discussed including, but not limited to, risks of anesthesia complications, infection, pain, persistent symptoms, stiffness, need for future surgery.  The patient understands, agrees and elects to proceed with surgery.    DESCRIPTION OF PROCEDURE: The patient was met in the pre-operative area and their identity was verified.  The operative location and laterality was also verified and marked.  The patient was brought to the OR and was placed supine on the table.  After repeat patient identification with the operative team anesthesia was provided and the patient was prepped and draped in the usual sterile fashion.  A final timeout was performed verifying the correction patient, procedure, location and laterality.  The left upper extremity was elevated and tourniquet inflated to 250 mmHg.  A longitudinal incision was made over the palmar aspect of the left middle finger at the level of the A1 pulley.  Immediate purulence was identified and cultures were obtained.  The abscess was  evacuated dissection was carried down to the flexor tendon sheath and the flexor tendon sheath was opened and evacuated at the level of the A1 pulley.  The wound appeared clean all loculations were broken up and there was no residual infection visible or palpable.  The wound was thoroughly irrigated with 3 L of normal saline via low flow tubing.  The wound was packed and dressed with a sterile soft bandage.  The tourniquet was deflated and the fingers were pink and warm well-perfused with brisk capillary refill.  All counts were correct x 2.  The patient was awoken from anesthesia and brought to PACU for recovery in stable condition.   Philipp Ovens, MD

## 2023-04-13 ENCOUNTER — Other Ambulatory Visit: Payer: Self-pay | Admitting: Physician Assistant

## 2023-04-13 ENCOUNTER — Encounter (HOSPITAL_COMMUNITY): Payer: Self-pay | Admitting: Orthopedic Surgery

## 2023-04-13 NOTE — Progress Notes (Signed)
Tried calling patient to review culture results and post op instructions from recent hand surgery. No answer. Left voicemail with our office information to call back.

## 2023-04-13 NOTE — Anesthesia Postprocedure Evaluation (Signed)
Anesthesia Post Note  Patient: Corey Simon [facility]  Procedure(s) Performed: IRRIGATION AND DEBRIDEMENT LEFT HAND (Left: Hand)     Patient location during evaluation: PACU Anesthesia Type: General Level of consciousness: awake and alert Pain management: pain level controlled Vital Signs Assessment: post-procedure vital signs reviewed and stable Respiratory status: spontaneous breathing, nonlabored ventilation and respiratory function stable Cardiovascular status: blood pressure returned to baseline and stable Postop Assessment: no apparent nausea or vomiting Anesthetic complications: no   No notable events documented.  Last Vitals:  Vitals:   04/10/23 1545 04/10/23 1554  BP: (!) 145/99 (!) 137/95  Pulse: 64 (!) 50  Resp: 17 16  Temp:  36.4 C  SpO2: 98% 100%    Last Pain:  Vitals:   04/10/23 1648  TempSrc:   PainSc: 4                  Lowella Curb

## 2023-04-13 NOTE — Plan of Care (Signed)
[facility] Tonsillectomy/Adenoidectomy, Postoperative PEDS care plan entered in error.

## 2023-04-15 LAB — AEROBIC/ANAEROBIC CULTURE W GRAM STAIN (SURGICAL/DEEP WOUND)

## 2023-04-16 ENCOUNTER — Emergency Department (HOSPITAL_COMMUNITY)
Admission: EM | Admit: 2023-04-16 | Discharge: 2023-04-16 | Disposition: A | Discharge status: Home / Self Care | Payer: Medicaid Other | Attending: Student | Admitting: Student

## 2023-04-16 ENCOUNTER — Encounter (HOSPITAL_COMMUNITY): Payer: Self-pay

## 2023-04-16 DIAGNOSIS — Z4801 Encounter for change or removal of surgical wound dressing: Secondary | ICD-10-CM | POA: Insufficient documentation

## 2023-04-16 NOTE — ED Triage Notes (Signed)
Pt states that the packing is coming out of his dressing and needs it changed.

## 2023-04-16 NOTE — ED Provider Notes (Signed)
[place] EMERGENCY DEPARTMENT AT [facility] Provider Note   CSN: [id number] Arrival date & time: 04/16/23  0247     History  Chief Complaint  Patient presents with   Dressing Change    Corey Simon is a 27 y.o. male.  Patient with past medical history significant for infected abrasion of the left hand with subsequent surgical irrigation and drainage presents to the emergency department with concerns about his dressing.  Patient has not followed up with his orthopedic team.  He is concerned about a piece of dressing which is stuck in the wound.  He denies fever, nausea, systemic symptoms.  He denies drainage from the wound.  HPI     Home Medications Prior to Admission medications   Not on File      Allergies    Patient has no known allergies.    Review of Systems   Review of Systems  Physical Exam Updated Vital Signs BP 137/79 (BP Location: Right Arm)   Pulse 85   Temp 98.2 F (36.8 C) (Oral)   Resp 17   SpO2 100%  Physical Exam Vitals and nursing note reviewed.  HENT:     Head: Normocephalic and atraumatic.  Eyes:     Pupils: Pupils are equal, round, and reactive to light.  Pulmonary:     Effort: Pulmonary effort is normal. No respiratory distress.  Musculoskeletal:     Cervical back: Normal range of motion.     Comments: Wound in anterior surface of the palm of left hand with small amount of packing which fell out when changing overall dressing.  Packing appeared to be stuck to overall dressing and patient appeared to have been pulling it out on his own.  No surrounding erythema, no drainage.  Skin:    General: Skin is dry.  Neurological:     Mental Status: He is alert.  Psychiatric:        Speech: Speech normal.        Behavior: Behavior normal.     ED Results / Procedures / Treatments   Labs (all labs ordered are listed, but only abnormal results are displayed) Labs Reviewed - No data to display  EKG None  Radiology No  results found.  Procedures Procedures    Medications Ordered in ED Medications - No data to display  ED Course/ Medical Decision Making/ A&P                                 Medical Decision Making  Patient presents to the emergency room with a chief complaint of needing a wound check.  Patient was concerned because there was "material" in the wound of his left hand.  Upon inspection this was packing material which have been mostly removed by the patient, likely accidentally by moving the overlying dressing.  The remainder of the packing fell out when the gauze dressing was removed.  Dressing reapplied by nursing staff.  The wound does not appear to have surrounding infection.  There was no drainage or surrounding erythema.  The patient did not understand how to follow-up with hand surgery.  Chart review shows that they attempted to contact the patient with no success to discuss postop instructions.  The patient thought the surgeon might be here at the hospital.  I have explained to the patient how to follow-up with hand surgery and provided the name and phone number for his surgeon.  The patient will need to follow-up with their clinic for further postop management.        Final Clinical Impression(s) / ED Diagnoses Final diagnoses:  Dressing change or removal, surgical wound    Rx / DC Orders ED Discharge Orders     None         Pamala Duffel 04/16/23 0351    Glendora Score, MD 04/16/23 2324

## 2023-04-16 NOTE — Discharge Instructions (Signed)
Please call your surgeons office to schedule a postop appointment.  Neurosurgeons office will be able to provide further instructions on wound care and dressing changes.

## 2023-07-31 ENCOUNTER — Emergency Department (HOSPITAL_COMMUNITY)
Admission: EM | Admit: 2023-07-31 | Discharge: 2023-07-31 | Disposition: A | Discharge status: Home / Self Care | Payer: Medicaid Other | Attending: Emergency Medicine | Admitting: Emergency Medicine

## 2023-07-31 ENCOUNTER — Encounter (HOSPITAL_COMMUNITY): Payer: Self-pay

## 2023-07-31 ENCOUNTER — Emergency Department (HOSPITAL_COMMUNITY): Payer: Medicaid Other

## 2023-07-31 ENCOUNTER — Other Ambulatory Visit: Payer: Self-pay

## 2023-07-31 DIAGNOSIS — T40411A Poisoning by fentanyl or fentanyl analogs, accidental (unintentional), initial encounter: Secondary | ICD-10-CM

## 2023-07-31 LAB — URINALYSIS, ROUTINE W REFLEX MICROSCOPIC
Bilirubin Urine: NEGATIVE
Glucose, UA: NEGATIVE mg/dL
Hgb urine dipstick: NEGATIVE
Ketones, ur: NEGATIVE mg/dL
Leukocytes,Ua: NEGATIVE
Nitrite: NEGATIVE
Protein, ur: NEGATIVE mg/dL
Specific Gravity, Urine: >1.03 — ABNORMAL HIGH (ref 1.005–1.030)
pH: 6 (ref 5.0–8.0)

## 2023-07-31 LAB — RAPID URINE DRUG SCREEN, HOSP PERFORMED
Amphetamines: NOT DETECTED
Barbiturates: NOT DETECTED
Benzodiazepines: NOT DETECTED
Cocaine: POSITIVE — AB
Opiates: NOT DETECTED
Tetrahydrocannabinol: POSITIVE — AB

## 2023-07-31 MED ORDER — SODIUM CHLORIDE 0.9 % IV BOLUS: 1000.0000 mL | Freq: Once | INTRAVENOUS | Status: DC

## 2023-07-31 MED ORDER — ONDANSETRON HCL 4 MG/2ML IJ SOLN: 4.0000 mg | Freq: Once | INTRAMUSCULAR | Status: DC

## 2023-07-31 NOTE — ED Notes (Signed)
Pt left AMA. MD notified.  
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
## 2023-07-31 NOTE — ED Notes (Signed)
 Pt refusing IV and blood work at this time. MD notified.

## 2023-10-09 ENCOUNTER — Encounter (HOSPITAL_COMMUNITY): Payer: Self-pay | Admitting: *Deleted

## 2023-10-09 ENCOUNTER — Emergency Department (HOSPITAL_COMMUNITY)
Admission: EM | Admit: 2023-10-09 | Discharge: 2023-10-09 | Disposition: A | Discharge status: Home / Self Care | Source: Home / Self Care | Attending: Emergency Medicine | Admitting: Emergency Medicine

## 2023-10-09 ENCOUNTER — Other Ambulatory Visit: Payer: Self-pay

## 2023-10-09 ENCOUNTER — Emergency Department (HOSPITAL_COMMUNITY)

## 2023-10-09 ENCOUNTER — Observation Stay (HOSPITAL_COMMUNITY)
Admission: EM | Admit: 2023-10-09 | Discharge: 2023-10-09 | Disposition: A | Discharge status: Home / Self Care | Attending: Surgery | Admitting: Surgery

## 2023-10-09 DIAGNOSIS — F1290 Cannabis use, unspecified, uncomplicated: Secondary | ICD-10-CM | POA: Diagnosis not present

## 2023-10-09 DIAGNOSIS — Z79899 Other long term (current) drug therapy: Secondary | ICD-10-CM | POA: Insufficient documentation

## 2023-10-09 DIAGNOSIS — F10220 Alcohol dependence with intoxication, uncomplicated: Secondary | ICD-10-CM | POA: Diagnosis not present

## 2023-10-09 DIAGNOSIS — S2241XA Multiple fractures of ribs, right side, initial encounter for closed fracture: Principal | ICD-10-CM | POA: Insufficient documentation

## 2023-10-09 DIAGNOSIS — S01311A Laceration without foreign body of right ear, initial encounter: Secondary | ICD-10-CM | POA: Insufficient documentation

## 2023-10-09 DIAGNOSIS — F10920 Alcohol use, unspecified with intoxication, uncomplicated: Secondary | ICD-10-CM

## 2023-10-09 DIAGNOSIS — R7402 Elevation of levels of lactic acid dehydrogenase (LDH): Secondary | ICD-10-CM | POA: Insufficient documentation

## 2023-10-09 DIAGNOSIS — R739 Hyperglycemia, unspecified: Secondary | ICD-10-CM

## 2023-10-09 DIAGNOSIS — R7989 Other specified abnormal findings of blood chemistry: Secondary | ICD-10-CM

## 2023-10-09 DIAGNOSIS — T07XXXA Unspecified multiple injuries, initial encounter: Principal | ICD-10-CM | POA: Diagnosis present

## 2023-10-09 DIAGNOSIS — M549 Dorsalgia, unspecified: Secondary | ICD-10-CM | POA: Insufficient documentation

## 2023-10-09 DIAGNOSIS — Y92410 Unspecified street and highway as the place of occurrence of the external cause: Secondary | ICD-10-CM | POA: Insufficient documentation

## 2023-10-09 DIAGNOSIS — R451 Restlessness and agitation: Secondary | ICD-10-CM | POA: Diagnosis present

## 2023-10-09 DIAGNOSIS — S22009A Unspecified fracture of unspecified thoracic vertebra, initial encounter for closed fracture: Secondary | ICD-10-CM | POA: Diagnosis not present

## 2023-10-09 DIAGNOSIS — S22008A Other fracture of unspecified thoracic vertebra, initial encounter for closed fracture: Secondary | ICD-10-CM

## 2023-10-09 LAB — I-STAT CHEM 8, ED
BUN: 8 mg/dL (ref 6–20)
Calcium, Ion: 1.09 mmol/L — ABNORMAL LOW (ref 1.15–1.40)
Chloride: 105 mmol/L (ref 98–111)
Creatinine, Ser: 1.2 mg/dL (ref 0.61–1.24)
Glucose, Bld: 118 mg/dL — ABNORMAL HIGH (ref 70–99)
HCT: 50 % (ref 39.0–52.0)
Hemoglobin: 17 g/dL (ref 13.0–17.0)
Potassium: 3.8 mmol/L (ref 3.5–5.1)
Sodium: 143 mmol/L (ref 135–145)
TCO2: 24 mmol/L (ref 22–32)

## 2023-10-09 LAB — SAMPLE TO BLOOD BANK

## 2023-10-09 LAB — I-STAT ARTERIAL BLOOD GAS, ED
Acid-base deficit: 3 mmol/L — ABNORMAL HIGH (ref 0.0–2.0)
Bicarbonate: 22.5 mmol/L (ref 20.0–28.0)
Calcium, Ion: 1.14 mmol/L — ABNORMAL LOW (ref 1.15–1.40)
HCT: 44 % (ref 39.0–52.0)
Hemoglobin: 15 g/dL (ref 13.0–17.0)
O2 Saturation: 100 %
Patient temperature: 98.7
Potassium: 3.5 mmol/L (ref 3.5–5.1)
Sodium: 141 mmol/L (ref 135–145)
TCO2: 24 mmol/L (ref 22–32)
pCO2 arterial: 41.1 mmHg (ref 32–48)
pH, Arterial: 7.346 — ABNORMAL LOW (ref 7.35–7.45)
pO2, Arterial: 445 mmHg — ABNORMAL HIGH (ref 83–108)

## 2023-10-09 LAB — COMPREHENSIVE METABOLIC PANEL WITH GFR
ALT: 23 U/L (ref 0–44)
AST: 31 U/L (ref 15–41)
Albumin: 4 g/dL (ref 3.5–5.0)
Alkaline Phosphatase: 80 U/L (ref 38–126)
Anion gap: 14 (ref 5–15)
BUN: 9 mg/dL (ref 6–20)
CO2: 22 mmol/L (ref 22–32)
Calcium: 8.9 mg/dL (ref 8.9–10.3)
Chloride: 105 mmol/L (ref 98–111)
Creatinine, Ser: 0.89 mg/dL (ref 0.61–1.24)
GFR, Estimated: >60 mL/min (ref >60)
Glucose, Bld: 122 mg/dL — ABNORMAL HIGH (ref 70–99)
Potassium: 3.6 mmol/L (ref 3.5–5.1)
Sodium: 141 mmol/L (ref 135–145)
Total Bilirubin: 0.4 mg/dL (ref 0.0–1.2)
Total Protein: 7.3 g/dL (ref 6.5–8.1)

## 2023-10-09 LAB — CBC
HCT: 48.7 % (ref 39.0–52.0)
Hemoglobin: 16.3 g/dL (ref 13.0–17.0)
MCH: 32.1 pg (ref 26.0–34.0)
MCHC: 33.5 g/dL (ref 30.0–36.0)
MCV: 95.9 fL (ref 80.0–100.0)
Platelets: 285 10*3/uL (ref 150–400)
RBC: 5.08 MIL/uL (ref 4.22–5.81)
RDW: 12.4 % (ref 11.5–15.5)
WBC: 9.7 10*3/uL (ref 4.0–10.5)
nRBC: 0 % (ref 0.0–0.2)

## 2023-10-09 LAB — RAPID URINE DRUG SCREEN, HOSP PERFORMED
Amphetamines: NOT DETECTED
Barbiturates: NOT DETECTED
Benzodiazepines: NOT DETECTED
Cocaine: POSITIVE — AB
Opiates: NOT DETECTED
Tetrahydrocannabinol: POSITIVE — AB

## 2023-10-09 LAB — PROTIME-INR
INR: 1 (ref 0.8–1.2)
Prothrombin Time: 13.2 s (ref 11.4–15.2)

## 2023-10-09 LAB — ETHANOL: Alcohol, Ethyl (B): 234 mg/dL — ABNORMAL HIGH (ref <10)

## 2023-10-09 LAB — I-STAT CG4 LACTIC ACID, ED: Lactic Acid, Venous: 3.2 mmol/L (ref 0.5–1.9)

## 2023-10-09 MED ORDER — KETOROLAC TROMETHAMINE 15 MG/ML IJ SOLN
15.0000 mg | Freq: Once | INTRAMUSCULAR | Status: AC
  Administered 2023-10-09: 15 mg via INTRAVENOUS
  Filled 2023-10-09: qty 1

## 2023-10-09 MED ORDER — FENTANYL CITRATE PF 50 MCG/ML IJ SOSY: 50.0000 ug | PREFILLED_SYRINGE | INTRAMUSCULAR | Status: DC | PRN

## 2023-10-09 MED ORDER — ENOXAPARIN SODIUM 30 MG/0.3ML IJ SOSY: 30.0000 mg | PREFILLED_SYRINGE | Freq: Two times a day (BID) | INTRAMUSCULAR | Status: DC

## 2023-10-09 MED ORDER — OXYCODONE HCL 5 MG PO TABS: 5.0000 mg | ORAL_TABLET | ORAL | Status: DC | PRN

## 2023-10-09 MED ORDER — MIDAZOLAM-SODIUM CHLORIDE 100-0.9 MG/100ML-% IV SOLN
0.0000 mg/h | INTRAVENOUS | Status: DC
  Administered 2023-10-09: 2 mg/h via INTRAVENOUS
  Filled 2023-10-09: qty 100

## 2023-10-09 MED ORDER — PROPOFOL 1000 MG/100ML IV EMUL
0.0000 ug/kg/min | INTRAVENOUS | Status: DC
  Administered 2023-10-09: 10 ug/kg/min via INTRAVENOUS

## 2023-10-09 MED ORDER — MIDAZOLAM HCL 2 MG/2ML IJ SOLN: 1.0000 mg | INTRAMUSCULAR | Status: DC | PRN

## 2023-10-09 MED ORDER — ACETAMINOPHEN 500 MG PO TABS: 1000.0000 mg | ORAL_TABLET | Freq: Four times a day (QID) | ORAL | Status: DC

## 2023-10-09 MED ORDER — DEXMEDETOMIDINE HCL IN NACL 400 MCG/100ML IV SOLN
0.0000 ug/kg/h | INTRAVENOUS | Status: DC
  Administered 2023-10-09: 0.4 ug/kg/h via INTRAVENOUS
  Filled 2023-10-09 (×2): qty 100

## 2023-10-09 MED ORDER — OXYCODONE HCL 5 MG/5ML PO SOLN: 5.0000 mg | ORAL | Status: DC | PRN

## 2023-10-09 MED ORDER — DOCUSATE SODIUM 50 MG/5ML PO LIQD: 100.0000 mg | Freq: Two times a day (BID) | ORAL | Status: DC

## 2023-10-09 MED ORDER — ETOMIDATE 2 MG/ML IV SOLN
INTRAVENOUS | Status: AC | PRN
  Administered 2023-10-09: 20 mg via INTRAVENOUS

## 2023-10-09 MED ORDER — POLYETHYLENE GLYCOL 3350 17 G PO PACK: 17.0000 g | PACK | Freq: Every day | ORAL | Status: DC

## 2023-10-09 MED ORDER — ONDANSETRON 4 MG PO TBDP: 4.0000 mg | ORAL_TABLET | Freq: Four times a day (QID) | ORAL | Status: DC | PRN

## 2023-10-09 MED ORDER — MORPHINE SULFATE (PF) 4 MG/ML IV SOLN
6.0000 mg | Freq: Once | INTRAVENOUS | Status: AC
  Administered 2023-10-09: 6 mg via INTRAVENOUS
  Filled 2023-10-09: qty 2

## 2023-10-09 MED ORDER — PROPOFOL 1000 MG/100ML IV EMUL
INTRAVENOUS | Status: AC | PRN
  Administered 2023-10-09: 10 ug/kg/min via INTRAVENOUS

## 2023-10-09 MED ORDER — HYDRALAZINE HCL 20 MG/ML IJ SOLN: 10.0000 mg | INTRAMUSCULAR | Status: DC | PRN

## 2023-10-09 MED ORDER — LORAZEPAM 2 MG/ML IJ SOLN
2.0000 mg | Freq: Once | INTRAMUSCULAR | Status: AC
  Administered 2023-10-09: 2 mg via INTRAVENOUS

## 2023-10-09 MED ORDER — OXYCODONE HCL 5 MG PO TABS: 5.0000 mg | ORAL_TABLET | Freq: Four times a day (QID) | ORAL | 0 refills | Status: AC | PRN

## 2023-10-09 MED ORDER — ONDANSETRON HCL 4 MG/2ML IJ SOLN: 4.0000 mg | Freq: Four times a day (QID) | INTRAMUSCULAR | Status: DC | PRN

## 2023-10-09 MED ORDER — FENTANYL BOLUS VIA INFUSION
50.0000 ug | INTRAVENOUS | Status: DC | PRN
  Administered 2023-10-09 (×2): 100 ug via INTRAVENOUS

## 2023-10-09 MED ORDER — FENTANYL CITRATE PF 50 MCG/ML IJ SOSY: 50.0000 ug | PREFILLED_SYRINGE | Freq: Once | INTRAMUSCULAR | Status: DC

## 2023-10-09 MED ORDER — SUCCINYLCHOLINE CHLORIDE 20 MG/ML IJ SOLN
INTRAMUSCULAR | Status: AC | PRN
  Administered 2023-10-09: 100 mg via INTRAVENOUS

## 2023-10-09 MED ORDER — IOHEXOL 350 MG/ML SOLN
75.0000 mL | Freq: Once | INTRAVENOUS | Status: AC | PRN
  Administered 2023-10-09: 75 mL via INTRAVENOUS

## 2023-10-09 MED ORDER — METOPROLOL TARTRATE 5 MG/5ML IV SOLN: 5.0000 mg | Freq: Four times a day (QID) | INTRAVENOUS | Status: DC | PRN

## 2023-10-09 MED ORDER — FENTANYL 2500MCG IN NS 250ML (10MCG/ML) PREMIX INFUSION
50.0000 ug/h | INTRAVENOUS | Status: DC
Start: 2023-10-09 — End: 2023-10-09
  Administered 2023-10-09: 100 ug/h via INTRAVENOUS
  Filled 2023-10-09: qty 250

## 2023-10-09 MED ORDER — MIDAZOLAM BOLUS VIA INFUSION: 0.0000 mg | INTRAVENOUS | Status: DC | PRN

## 2023-10-09 NOTE — ED Notes (Signed)
 Patient transported to CT

## 2023-10-09 NOTE — ED Notes (Signed)
 Pt awake, nodding and shaking head in response to questions.
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
## 2023-10-09 NOTE — Progress Notes (Signed)
 Pt. Transported from RM16 in ED to CT4 with Rnx2 and RT without complications on 100%O2. VS stable at this time
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
## 2023-10-09 NOTE — ED Notes (Signed)
 Central telemetry contracted for cardiac monitoring.

## 2023-10-09 NOTE — Progress Notes (Signed)
 Orthopedic Tech Progress Note Patient Details:  Corey Simon [facility] [birth year] [id number]  Ortho Devices Type of Ortho Device: Thoracolumbar corset (TLSO) Ortho Device/Splint Interventions: Ordered, Application, Adjustment   Post Interventions Patient Tolerated: Well Instructions Provided: Care of device, Adjustment of device  Herbie Loll 10/09/2023, 9:18 PM
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
## 2023-10-09 NOTE — Procedures (Signed)
 Extubation Procedure Note  Patient Details:   Name: Corey Simon [facility] DOB: February 03, 1996 MRN: [id number]   Airway Documentation:    Vent end date: 10/09/23 Vent end time: 1206   Evaluation  O2 sats: stable throughout Complications: No apparent complications Patient did tolerate procedure well. Bilateral Breath Sounds: Diminished   Yes  Patient extubated per order to room air with no apparent complications. Positive cuff leak was noted prior to extubation. Patient is alert, has strong cough, and is able to speak. Vitals are stable.  [facility] 10/09/2023, 12:15 PM
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
## 2023-10-09 NOTE — ED Notes (Signed)
 Pt denies abdominal pain, able to ambulate. Removed foley per pt request. Pt called family member to come pick him up.
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
## 2023-10-19 ENCOUNTER — Ambulatory Visit: Admitting: Student in an Organized Health Care Education/Training Program

## 2024-06-15 ENCOUNTER — Other Ambulatory Visit: Payer: Self-pay

## 2024-06-15 ENCOUNTER — Emergency Department (HOSPITAL_COMMUNITY)
Admission: EM | Admit: 2024-06-15 | Discharge: 2024-06-16 | Disposition: A | Discharge status: Home / Self Care | Attending: Emergency Medicine | Admitting: Emergency Medicine

## 2024-06-15 DIAGNOSIS — S40211A Abrasion of right shoulder, initial encounter: Secondary | ICD-10-CM | POA: Insufficient documentation

## 2024-06-15 DIAGNOSIS — S0083XA Contusion of other part of head, initial encounter: Secondary | ICD-10-CM | POA: Diagnosis not present

## 2024-06-15 DIAGNOSIS — S20319A Abrasion of unspecified front wall of thorax, initial encounter: Secondary | ICD-10-CM | POA: Insufficient documentation

## 2024-06-15 DIAGNOSIS — S0990XA Unspecified injury of head, initial encounter: Secondary | ICD-10-CM | POA: Diagnosis present

## 2024-06-15 DIAGNOSIS — S40212A Abrasion of left shoulder, initial encounter: Secondary | ICD-10-CM | POA: Insufficient documentation

## 2024-06-15 DIAGNOSIS — M25531 Pain in right wrist: Secondary | ICD-10-CM | POA: Insufficient documentation
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
## 2024-06-16 ENCOUNTER — Emergency Department (HOSPITAL_COMMUNITY)

## 2024-06-16 MED ORDER — OXYCODONE-ACETAMINOPHEN 5-325 MG PO TABS
1.0000 | ORAL_TABLET | Freq: Once | ORAL | Status: AC
  Administered 2024-06-16: 1 via ORAL
  Filled 2024-06-16: qty 1
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
